# Patient Record
Sex: Male | Born: 1990 | Race: White | Hispanic: No | Marital: Single | Smoking: Former smoker
Health system: Southern US, Community
[De-identification: ages and names within clinical notes are randomized; demographics above are authoritative.]

## PROBLEM LIST (undated history)

## (undated) HISTORY — PX: HERNIA REPAIR: SHX51

---

## 2018-05-27 ENCOUNTER — Inpatient Hospital Stay (HOSPITAL_COMMUNITY): Payer: Self-pay

## 2018-05-27 ENCOUNTER — Emergency Department (HOSPITAL_COMMUNITY): Payer: Self-pay

## 2018-05-27 ENCOUNTER — Inpatient Hospital Stay (HOSPITAL_COMMUNITY)
Admission: EM | Admit: 2018-05-27 | Discharge: 2018-05-30 | DRG: 964 | Disposition: A | Discharge status: Home / Self Care | Payer: Self-pay | Attending: General Surgery | Admitting: General Surgery

## 2018-05-27 ENCOUNTER — Inpatient Hospital Stay (HOSPITAL_COMMUNITY): Payer: Self-pay | Admitting: Anesthesiology

## 2018-05-27 ENCOUNTER — Other Ambulatory Visit: Payer: Self-pay

## 2018-05-27 ENCOUNTER — Encounter (HOSPITAL_COMMUNITY): Payer: Self-pay | Admitting: Anesthesiology

## 2018-05-27 ENCOUNTER — Encounter (HOSPITAL_COMMUNITY): Admission: EM | Disposition: A | Discharge status: Home / Self Care | Payer: Self-pay | Source: Home / Self Care

## 2018-05-27 DIAGNOSIS — S82025A Nondisplaced longitudinal fracture of left patella, initial encounter for closed fracture: Secondary | ICD-10-CM | POA: Diagnosis present

## 2018-05-27 DIAGNOSIS — R402142 Coma scale, eyes open, spontaneous, at arrival to emergency department: Secondary | ICD-10-CM | POA: Diagnosis present

## 2018-05-27 DIAGNOSIS — H1131 Conjunctival hemorrhage, right eye: Secondary | ICD-10-CM | POA: Diagnosis present

## 2018-05-27 DIAGNOSIS — F1290 Cannabis use, unspecified, uncomplicated: Secondary | ICD-10-CM | POA: Diagnosis present

## 2018-05-27 DIAGNOSIS — Y92410 Unspecified street and highway as the place of occurrence of the external cause: Secondary | ICD-10-CM

## 2018-05-27 DIAGNOSIS — F1590 Other stimulant use, unspecified, uncomplicated: Secondary | ICD-10-CM | POA: Diagnosis present

## 2018-05-27 DIAGNOSIS — S0511XA Contusion of eyeball and orbital tissues, right eye, initial encounter: Secondary | ICD-10-CM | POA: Diagnosis present

## 2018-05-27 DIAGNOSIS — Z880 Allergy status to penicillin: Secondary | ICD-10-CM

## 2018-05-27 DIAGNOSIS — J939 Pneumothorax, unspecified: Secondary | ICD-10-CM

## 2018-05-27 DIAGNOSIS — S062X0A Diffuse traumatic brain injury without loss of consciousness, initial encounter: Secondary | ICD-10-CM

## 2018-05-27 DIAGNOSIS — Z419 Encounter for procedure for purposes other than remedying health state, unspecified: Secondary | ICD-10-CM

## 2018-05-27 DIAGNOSIS — S270XXA Traumatic pneumothorax, initial encounter: Secondary | ICD-10-CM | POA: Diagnosis present

## 2018-05-27 DIAGNOSIS — R402362 Coma scale, best motor response, obeys commands, at arrival to emergency department: Secondary | ICD-10-CM | POA: Diagnosis present

## 2018-05-27 DIAGNOSIS — S82035A Nondisplaced transverse fracture of left patella, initial encounter for closed fracture: Secondary | ICD-10-CM | POA: Diagnosis present

## 2018-05-27 DIAGNOSIS — S32421A Displaced fracture of posterior wall of right acetabulum, initial encounter for closed fracture: Secondary | ICD-10-CM | POA: Diagnosis present

## 2018-05-27 DIAGNOSIS — S82045A Nondisplaced comminuted fracture of left patella, initial encounter for closed fracture: Secondary | ICD-10-CM | POA: Diagnosis present

## 2018-05-27 DIAGNOSIS — S32401A Unspecified fracture of right acetabulum, initial encounter for closed fracture: Secondary | ICD-10-CM

## 2018-05-27 DIAGNOSIS — F17210 Nicotine dependence, cigarettes, uncomplicated: Secondary | ICD-10-CM | POA: Diagnosis present

## 2018-05-27 DIAGNOSIS — S0240CA Maxillary fracture, right side, initial encounter for closed fracture: Secondary | ICD-10-CM | POA: Diagnosis present

## 2018-05-27 DIAGNOSIS — S0292XA Unspecified fracture of facial bones, initial encounter for closed fracture: Secondary | ICD-10-CM

## 2018-05-27 DIAGNOSIS — R402252 Coma scale, best verbal response, oriented, at arrival to emergency department: Secondary | ICD-10-CM | POA: Diagnosis present

## 2018-05-27 DIAGNOSIS — S062X9A Diffuse traumatic brain injury with loss of consciousness of unspecified duration, initial encounter: Principal | ICD-10-CM | POA: Diagnosis present

## 2018-05-27 HISTORY — PX: ORIF ACETABULAR FRACTURE: SHX5029

## 2018-05-27 LAB — CBC
HEMATOCRIT: 43.6 % (ref 39.0–52.0)
HEMOGLOBIN: 14.6 g/dL (ref 13.0–17.0)
MCH: 29 pg (ref 26.0–34.0)
MCHC: 33.5 g/dL (ref 30.0–36.0)
MCV: 86.7 fL (ref 78.0–100.0)
Platelets: 265 10*3/uL (ref 150–400)
RBC: 5.03 MIL/uL (ref 4.22–5.81)
RDW: 12.9 % (ref 11.5–15.5)
WBC: 8.2 10*3/uL (ref 4.0–10.5)

## 2018-05-27 LAB — URINALYSIS, ROUTINE W REFLEX MICROSCOPIC
Bilirubin Urine: NEGATIVE
GLUCOSE, UA: NEGATIVE mg/dL
Hgb urine dipstick: NEGATIVE
Ketones, ur: NEGATIVE mg/dL
LEUKOCYTES UA: NEGATIVE
Nitrite: NEGATIVE
PH: 7 (ref 5.0–8.0)
Protein, ur: NEGATIVE mg/dL
Specific Gravity, Urine: 1.031 — ABNORMAL HIGH (ref 1.005–1.030)

## 2018-05-27 LAB — COMPREHENSIVE METABOLIC PANEL
ALT: 32 U/L (ref 0–44)
ANION GAP: 5 (ref 5–15)
AST: 33 U/L (ref 15–41)
Albumin: 3.7 g/dL (ref 3.5–5.0)
Alkaline Phosphatase: 65 U/L (ref 38–126)
BUN: 10 mg/dL (ref 6–20)
CHLORIDE: 108 mmol/L (ref 98–111)
CO2: 24 mmol/L (ref 22–32)
Calcium: 9 mg/dL (ref 8.9–10.3)
Creatinine, Ser: 0.88 mg/dL (ref 0.61–1.24)
GFR calc non Af Amer: >60 mL/min (ref >60)
Glucose, Bld: 103 mg/dL — ABNORMAL HIGH (ref 70–99)
Potassium: 3.7 mmol/L (ref 3.5–5.1)
SODIUM: 137 mmol/L (ref 135–145)
Total Bilirubin: 0.6 mg/dL (ref 0.3–1.2)
Total Protein: 6 g/dL — ABNORMAL LOW (ref 6.5–8.1)

## 2018-05-27 LAB — I-STAT CHEM 8, ED
BUN: 11 mg/dL (ref 6–20)
CALCIUM ION: 1.24 mmol/L (ref 1.15–1.40)
Chloride: 103 mmol/L (ref 98–111)
Creatinine, Ser: 0.8 mg/dL (ref 0.61–1.24)
GLUCOSE: 98 mg/dL (ref 70–99)
HCT: 42 % (ref 39.0–52.0)
HEMOGLOBIN: 14.3 g/dL (ref 13.0–17.0)
POTASSIUM: 3.7 mmol/L (ref 3.5–5.1)
Sodium: 139 mmol/L (ref 135–145)
TCO2: 24 mmol/L (ref 22–32)

## 2018-05-27 LAB — I-STAT TROPONIN, ED: TROPONIN I, POC: 0.01 ng/mL (ref 0.00–0.08)

## 2018-05-27 LAB — SAMPLE TO BLOOD BANK

## 2018-05-27 LAB — PROTIME-INR
INR: 0.92
Prothrombin Time: 12.2 seconds (ref 11.4–15.2)

## 2018-05-27 LAB — RAPID URINE DRUG SCREEN, HOSP PERFORMED
Amphetamines: NOT DETECTED
Barbiturates: NOT DETECTED
Benzodiazepines: NOT DETECTED
COCAINE: NOT DETECTED
OPIATES: NOT DETECTED
Tetrahydrocannabinol: NOT DETECTED

## 2018-05-27 LAB — ETHANOL: Alcohol, Ethyl (B): <10 mg/dL (ref <10)

## 2018-05-27 LAB — CDS SEROLOGY

## 2018-05-27 LAB — I-STAT CG4 LACTIC ACID, ED: Lactic Acid, Venous: 1.84 mmol/L (ref 0.5–1.9)

## 2018-05-27 SURGERY — OPEN REDUCTION INTERNAL FIXATION (ORIF) ACETABULAR FRACTURE
Anesthesia: General | Site: Pelvis | Laterality: Right

## 2018-05-27 MED ORDER — LIDOCAINE 2% (20 MG/ML) 5 ML SYRINGE
INTRAMUSCULAR | Status: DC | PRN
  Administered 2018-05-27: 60 mg via INTRAVENOUS

## 2018-05-27 MED ORDER — SODIUM CHLORIDE 0.9 % IJ SOLN
INTRAMUSCULAR | Status: AC
  Filled 2018-05-27: qty 10

## 2018-05-27 MED ORDER — HYDROMORPHONE HCL 1 MG/ML IJ SOLN: 0.2500 mg | INTRAMUSCULAR | Status: DC | PRN

## 2018-05-27 MED ORDER — KCL IN DEXTROSE-NACL 20-5-0.45 MEQ/L-%-% IV SOLN
INTRAVENOUS | Status: DC
  Administered 2018-05-28 (×2): via INTRAVENOUS
  Filled 2018-05-27 (×4): qty 1000

## 2018-05-27 MED ORDER — ROCURONIUM BROMIDE 50 MG/5ML IV SOSY
PREFILLED_SYRINGE | INTRAVENOUS | Status: AC
  Filled 2018-05-27: qty 5

## 2018-05-27 MED ORDER — CHLORHEXIDINE GLUCONATE 4 % EX LIQD: 60.0000 mL | Freq: Once | CUTANEOUS | Status: DC

## 2018-05-27 MED ORDER — GLYCOPYRROLATE PF 0.2 MG/ML IJ SOSY
PREFILLED_SYRINGE | INTRAMUSCULAR | Status: AC
  Filled 2018-05-27: qty 2

## 2018-05-27 MED ORDER — SUGAMMADEX SODIUM 500 MG/5ML IV SOLN
INTRAVENOUS | Status: AC
  Filled 2018-05-27: qty 5

## 2018-05-27 MED ORDER — FENTANYL CITRATE (PF) 100 MCG/2ML IJ SOLN
INTRAMUSCULAR | Status: AC | PRN
  Administered 2018-05-27: 50 ug via INTRAVENOUS

## 2018-05-27 MED ORDER — SALINE SPRAY 0.65 % NA SOLN
4.0000 | Freq: Four times a day (QID) | NASAL | Status: DC
  Administered 2018-05-27: 4 via NASAL
  Filled 2018-05-27 (×2): qty 44

## 2018-05-27 MED ORDER — MIDAZOLAM HCL 2 MG/2ML IJ SOLN
INTRAMUSCULAR | Status: AC
  Filled 2018-05-27: qty 2

## 2018-05-27 MED ORDER — SODIUM CHLORIDE 0.9 % IV BOLUS
1000.0000 mL | Freq: Once | INTRAVENOUS | Status: AC
  Administered 2018-05-27: 1000 mL via INTRAVENOUS

## 2018-05-27 MED ORDER — CEFAZOLIN SODIUM-DEXTROSE 2-4 GM/100ML-% IV SOLN
2.0000 g | INTRAVENOUS | Status: AC
  Administered 2018-05-27: 2 g via INTRAVENOUS

## 2018-05-27 MED ORDER — CEFAZOLIN SODIUM-DEXTROSE 2-4 GM/100ML-% IV SOLN
INTRAVENOUS | Status: AC
  Filled 2018-05-27: qty 100

## 2018-05-27 MED ORDER — FENTANYL CITRATE (PF) 100 MCG/2ML IJ SOLN
INTRAMUSCULAR | Status: DC | PRN
  Administered 2018-05-27: 100 ug via INTRAVENOUS

## 2018-05-27 MED ORDER — ONDANSETRON HCL 4 MG/2ML IJ SOLN: 4.0000 mg | Freq: Four times a day (QID) | INTRAMUSCULAR | Status: DC | PRN

## 2018-05-27 MED ORDER — LACTATED RINGERS IV SOLN
INTRAVENOUS | Status: DC
  Administered 2018-05-27: 14:00:00 via INTRAVENOUS

## 2018-05-27 MED ORDER — HYDROMORPHONE HCL 1 MG/ML IJ SOLN
1.0000 mg | INTRAMUSCULAR | Status: DC | PRN
  Administered 2018-05-27 – 2018-05-30 (×10): 1 mg via INTRAVENOUS
  Filled 2018-05-27 (×10): qty 1

## 2018-05-27 MED ORDER — FAMOTIDINE IN NACL 20-0.9 MG/50ML-% IV SOLN
20.0000 mg | Freq: Two times a day (BID) | INTRAVENOUS | Status: DC
  Administered 2018-05-27: 20 mg via INTRAVENOUS
  Filled 2018-05-27 (×2): qty 50

## 2018-05-27 MED ORDER — PROPOFOL 10 MG/ML IV BOLUS
INTRAVENOUS | Status: DC | PRN
  Administered 2018-05-27: 190 mg via INTRAVENOUS

## 2018-05-27 MED ORDER — SUCCINYLCHOLINE CHLORIDE 200 MG/10ML IV SOSY
PREFILLED_SYRINGE | INTRAVENOUS | Status: AC
  Filled 2018-05-27: qty 10

## 2018-05-27 MED ORDER — MIDAZOLAM HCL 5 MG/5ML IJ SOLN
INTRAMUSCULAR | Status: DC | PRN
  Administered 2018-05-27: 2 mg via INTRAVENOUS

## 2018-05-27 MED ORDER — IOPAMIDOL (ISOVUE-370) INJECTION 76%
100.0000 mL | Freq: Once | INTRAVENOUS | Status: AC | PRN
  Administered 2018-05-27: 100 mL via INTRAVENOUS

## 2018-05-27 MED ORDER — OXYCODONE HCL 5 MG PO TABS: 5.0000 mg | ORAL_TABLET | Freq: Once | ORAL | Status: DC | PRN

## 2018-05-27 MED ORDER — BUPIVACAINE-EPINEPHRINE 0.25% -1:200000 IJ SOLN
INTRAMUSCULAR | Status: DC | PRN
  Administered 2018-05-27: 30 mL

## 2018-05-27 MED ORDER — BUPIVACAINE-EPINEPHRINE (PF) 0.25% -1:200000 IJ SOLN
INTRAMUSCULAR | Status: AC
  Filled 2018-05-27: qty 30

## 2018-05-27 MED ORDER — POVIDONE-IODINE 10 % EX SWAB: 2.0000 "application " | Freq: Once | CUTANEOUS | Status: DC

## 2018-05-27 MED ORDER — IOPAMIDOL (ISOVUE-370) INJECTION 76%
INTRAVENOUS | Status: AC
  Filled 2018-05-27: qty 100

## 2018-05-27 MED ORDER — FENTANYL CITRATE (PF) 100 MCG/2ML IJ SOLN
INTRAMUSCULAR | Status: AC
  Filled 2018-05-27: qty 2

## 2018-05-27 MED ORDER — OXYCODONE HCL 5 MG PO TABS
5.0000 mg | ORAL_TABLET | ORAL | Status: DC | PRN
  Administered 2018-05-27 – 2018-05-30 (×12): 10 mg via ORAL
  Filled 2018-05-27 (×12): qty 2

## 2018-05-27 MED ORDER — FENTANYL CITRATE (PF) 100 MCG/2ML IJ SOLN
INTRAMUSCULAR | Status: AC | PRN
  Administered 2018-05-27: 25 ug via INTRAVENOUS

## 2018-05-27 MED ORDER — PANTOPRAZOLE SODIUM 40 MG PO TBEC
40.0000 mg | DELAYED_RELEASE_TABLET | Freq: Two times a day (BID) | ORAL | Status: DC
  Administered 2018-05-27 – 2018-05-30 (×6): 40 mg via ORAL
  Filled 2018-05-27 (×6): qty 1

## 2018-05-27 MED ORDER — FENTANYL CITRATE (PF) 250 MCG/5ML IJ SOLN
INTRAMUSCULAR | Status: AC
  Filled 2018-05-27: qty 5

## 2018-05-27 MED ORDER — ROCURONIUM BROMIDE 10 MG/ML (PF) SYRINGE
PREFILLED_SYRINGE | INTRAVENOUS | Status: DC | PRN
  Administered 2018-05-27: 50 mg via INTRAVENOUS

## 2018-05-27 MED ORDER — ONDANSETRON 4 MG PO TBDP: 4.0000 mg | ORAL_TABLET | Freq: Four times a day (QID) | ORAL | Status: DC | PRN

## 2018-05-27 MED ORDER — PROPOFOL 10 MG/ML IV BOLUS
INTRAVENOUS | Status: AC
  Filled 2018-05-27: qty 20

## 2018-05-27 MED ORDER — ACETAMINOPHEN 500 MG PO TABS
1000.0000 mg | ORAL_TABLET | Freq: Three times a day (TID) | ORAL | Status: DC
  Administered 2018-05-27 – 2018-05-30 (×8): 1000 mg via ORAL
  Filled 2018-05-27 (×8): qty 2

## 2018-05-27 MED ORDER — SUGAMMADEX SODIUM 500 MG/5ML IV SOLN
INTRAVENOUS | Status: DC | PRN
  Administered 2018-05-27: 500 mg via INTRAVENOUS

## 2018-05-27 MED ORDER — LIDOCAINE 2% (20 MG/ML) 5 ML SYRINGE
INTRAMUSCULAR | Status: AC
  Filled 2018-05-27: qty 5

## 2018-05-27 MED ORDER — EPHEDRINE 5 MG/ML INJ
INTRAVENOUS | Status: AC
  Filled 2018-05-27: qty 20

## 2018-05-27 MED ORDER — OXYCODONE HCL 5 MG/5ML PO SOLN: 5.0000 mg | Freq: Once | ORAL | Status: DC | PRN

## 2018-05-27 MED ORDER — FENTANYL CITRATE (PF) 100 MCG/2ML IJ SOLN
25.0000 ug | Freq: Once | INTRAMUSCULAR | Status: AC
  Administered 2018-05-27: 25 ug via INTRAVENOUS

## 2018-05-27 SURGICAL SUPPLY — 57 items
BIT DRILL STEP 3.5 (DRILL) IMPLANT
BLADE CLIPPER SURG (BLADE) IMPLANT
BRUSH SCRUB SURG 4.25 DISP (MISCELLANEOUS) ×8 IMPLANT
CLOSURE WOUND 1/2 X4 (GAUZE/BANDAGES/DRESSINGS) ×1
COVER SURGICAL LIGHT HANDLE (MISCELLANEOUS) ×4 IMPLANT
DRAPE C-ARM 42X72 X-RAY (DRAPES) ×4 IMPLANT
DRAPE C-ARMOR (DRAPES) ×4 IMPLANT
DRAPE INCISE IOBAN 66X45 STRL (DRAPES) ×4 IMPLANT
DRAPE INCISE IOBAN 85X60 (DRAPES) ×4 IMPLANT
DRAPE ORTHO SPLIT 77X108 STRL (DRAPES) ×4
DRAPE SURG ORHT 6 SPLT 77X108 (DRAPES) ×4 IMPLANT
DRAPE U-SHAPE 47X51 STRL (DRAPES) ×4 IMPLANT
DRILL STEP 3.5 (DRILL)
DRSG MEPILEX BORDER 4X12 (GAUZE/BANDAGES/DRESSINGS) IMPLANT
DRSG MEPILEX BORDER 4X8 (GAUZE/BANDAGES/DRESSINGS) IMPLANT
ELECT BLADE 6.5 EXT (BLADE) ×4 IMPLANT
ELECT REM PT RETURN 9FT ADLT (ELECTROSURGICAL)
ELECTRODE REM PT RTRN 9FT ADLT (ELECTROSURGICAL) IMPLANT
GLOVE BIO SURGEON STRL SZ7.5 (GLOVE) ×4 IMPLANT
GLOVE BIO SURGEON STRL SZ8 (GLOVE) ×4 IMPLANT
GLOVE BIOGEL PI IND STRL 7.5 (GLOVE) ×2 IMPLANT
GLOVE BIOGEL PI IND STRL 8 (GLOVE) ×2 IMPLANT
GLOVE BIOGEL PI INDICATOR 7.5 (GLOVE) ×2
GLOVE BIOGEL PI INDICATOR 8 (GLOVE) ×2
GOWN STRL REUS W/ TWL LRG LVL3 (GOWN DISPOSABLE) ×4 IMPLANT
GOWN STRL REUS W/ TWL XL LVL3 (GOWN DISPOSABLE) ×4 IMPLANT
GOWN STRL REUS W/TWL LRG LVL3 (GOWN DISPOSABLE) ×4
GOWN STRL REUS W/TWL XL LVL3 (GOWN DISPOSABLE) ×4
HANDPIECE INTERPULSE COAX TIP (DISPOSABLE)
KIT BASIN OR (CUSTOM PROCEDURE TRAY) ×4 IMPLANT
KIT TURNOVER KIT B (KITS) ×4 IMPLANT
MANIFOLD NEPTUNE II (INSTRUMENTS) ×4 IMPLANT
NS IRRIG 1000ML POUR BTL (IV SOLUTION) ×4 IMPLANT
PACK TOTAL JOINT (CUSTOM PROCEDURE TRAY) ×4 IMPLANT
PAD ARMBOARD 7.5X6 YLW CONV (MISCELLANEOUS) ×8 IMPLANT
RETRIEVER SUT HEWSON (MISCELLANEOUS) IMPLANT
SET HNDPC FAN SPRY TIP SCT (DISPOSABLE) IMPLANT
SPONGE LAP 18X18 X RAY DECT (DISPOSABLE) IMPLANT
STAPLER VISISTAT 35W (STAPLE) IMPLANT
STRIP CLOSURE SKIN 1/2X4 (GAUZE/BANDAGES/DRESSINGS) ×3 IMPLANT
SUCTION FRAZIER HANDLE 10FR (MISCELLANEOUS)
SUCTION TUBE FRAZIER 10FR DISP (MISCELLANEOUS) IMPLANT
SUT ETHILON 2 0 PSLX (SUTURE) ×8 IMPLANT
SUT FIBERWIRE #2 38 T-5 BLUE (SUTURE) ×8
SUT VIC AB 0 CT1 27 (SUTURE) ×2
SUT VIC AB 0 CT1 27XBRD ANBCTR (SUTURE) ×2 IMPLANT
SUT VIC AB 1 CT1 18XCR BRD 8 (SUTURE) ×2 IMPLANT
SUT VIC AB 1 CT1 27 (SUTURE) ×2
SUT VIC AB 1 CT1 27XBRD ANBCTR (SUTURE) ×2 IMPLANT
SUT VIC AB 1 CT1 8-18 (SUTURE) ×2
SUT VIC AB 2-0 CT1 27 (SUTURE) ×2
SUT VIC AB 2-0 CT1 TAPERPNT 27 (SUTURE) ×2 IMPLANT
SUTURE FIBERWR #2 38 T-5 BLUE (SUTURE) ×4 IMPLANT
TOWEL OR 17X24 6PK STRL BLUE (TOWEL DISPOSABLE) ×8 IMPLANT
TOWEL OR 17X26 10 PK STRL BLUE (TOWEL DISPOSABLE) ×8 IMPLANT
TRAY FOLEY MTR SLVR 16FR STAT (SET/KITS/TRAYS/PACK) IMPLANT
WATER STERILE IRR 1000ML POUR (IV SOLUTION) IMPLANT
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
## 2018-05-27 NOTE — ED Notes (Signed)
Called over to CT due to increase pain, fentanyl IV per standing order set.
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
## 2018-05-27 NOTE — Anesthesia Procedure Notes (Signed)
Procedure Name: Intubation Date/Time: 05/27/2018 3:55 PM Performed by: Moshe Salisbury, CRNA Pre-anesthesia Checklist: Patient identified, Emergency Drugs available, Suction available and Patient being monitored Patient Re-evaluated:Patient Re-evaluated prior to induction Oxygen Delivery Method: Circle System Utilized Preoxygenation: Pre-oxygenation with 100% oxygen Induction Type: IV induction Ventilation: Mask ventilation without difficulty Laryngoscope Size: Mac and 4 Grade View: Grade II Tube type: Oral Tube size: 8.0 mm Number of attempts: 1 Airway Equipment and Method: Stylet Placement Confirmation: ETT inserted through vocal cords under direct vision,  positive ETCO2 and breath sounds checked- equal and bilateral Secured at: 22 cm Tube secured with: Tape Dental Injury: Teeth and Oropharynx as per pre-operative assessment

## 2018-05-27 NOTE — Progress Notes (Signed)
Chaplain to Trauma C. Patient not available.  EMS contacted his girlfriend who will be on her way. Patient is stable and girlfriend will be able to see him when she arrives.  Chaplain can be re-paged if needed.   05/27/18 0700  Clinical Encounter Type  Visited With Patient not available  Visit Type ED;Trauma  Referral From Physician  Consult/Referral To Chaplain
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
## 2018-05-27 NOTE — Transfer of Care (Signed)
Immediate Anesthesia Transfer of Care Note  Patient: Brent Owens  Procedure(s) Performed: TREATMENT OF ACETABULAR FRACUTURE UNDER ANESTHESIA (Right Pelvis) TREATMENT OF LEFT KNEE UNDER ANESTHESIA, ASPIRATION OF LEFT KNEE, INJECTION LOCAL ANESTHETIC  LEFT KNEE (Left Knee)  Patient Location: PACU  Anesthesia Type:General  Level of Consciousness: drowsy  Airway & Oxygen Therapy: Patient Spontanous Breathing and Patient connected to nasal cannula oxygen  Post-op Assessment: Report given to RN, Post -op Vital signs reviewed and stable and Patient moving all extremities  Post vital signs: Reviewed and stable  Last Vitals:  Vitals Value Taken Time  BP 109/45 05/27/2018  4:34 PM  Temp    Pulse 72 05/27/2018  4:37 PM  Resp 16 05/27/2018  4:37 PM  SpO2 100 % 05/27/2018  4:37 PM  Vitals shown include unvalidated device data.  Last Pain:  Vitals:   05/27/18 1227  TempSrc: Oral  PainSc:          Complications: No apparent anesthesia complications

## 2018-05-27 NOTE — Progress Notes (Signed)
Orthopedic Tech Progress Note Patient Details:  Cheri RousWakefield Jj Doe 10/05/1874 [id number] Made level 2 trauma visit Patient ID: Clyda GreenerWakefield Jj Doe, male   DOB: 10/05/1874, [age] y.o.   MRN: [id number]   Nikki DomCrawford, Kymir Coles 05/27/2018, 7:24 AM
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
## 2018-05-27 NOTE — ED Notes (Signed)
Family at bedside. 

## 2018-05-27 NOTE — Anesthesia Preprocedure Evaluation (Addendum)
Anesthesia Evaluation  Patient identified by MRN, date of birth, ID band Patient awake    Reviewed: Allergy & Precautions, H&P , NPO status , Patient's Chart, lab work & pertinent test results  Airway Mallampati: II  TM Distance: >3 FB Neck ROM: full    Dental  (+) Teeth Intact, Dental Advisory Given, Chipped   Pulmonary neg pulmonary ROS,    breath sounds clear to auscultation       Cardiovascular negative cardio ROS   Rhythm:regular Rate:Normal     Neuro/Psych    GI/Hepatic   Endo/Other    Renal/GU      Musculoskeletal   Abdominal   Peds  Hematology   Anesthesia Other Findings   Reproductive/Obstetrics                            Anesthesia Physical Anesthesia Plan  ASA: I  Anesthesia Plan: General   Post-op Pain Management:    Induction: Intravenous  PONV Risk Score and Plan: 2 and Ondansetron, Dexamethasone, Midazolam and Treatment may vary due to age or medical condition  Airway Management Planned: Oral ETT  Additional Equipment:   Intra-op Plan:   Post-operative Plan: Extubation in OR  Informed Consent: I have reviewed the patients History and Physical, chart, labs and discussed the procedure including the risks, benefits and alternatives for the proposed anesthesia with the patient or authorized representative who has indicated his/her understanding and acceptance.     Plan Discussed with: CRNA, Anesthesiologist and Surgeon  Anesthesia Plan Comments:         Anesthesia Quick Evaluation

## 2018-05-27 NOTE — ED Notes (Addendum)
Report attempted nurse to call back.  
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
## 2018-05-28 ENCOUNTER — Inpatient Hospital Stay (HOSPITAL_COMMUNITY): Payer: Self-pay

## 2018-05-28 LAB — CBC
HCT: 38.5 % — ABNORMAL LOW (ref 39.0–52.0)
HEMOGLOBIN: 12.9 g/dL — AB (ref 13.0–17.0)
MCH: 28.9 pg (ref 26.0–34.0)
MCHC: 33.5 g/dL (ref 30.0–36.0)
MCV: 86.3 fL (ref 78.0–100.0)
PLATELETS: 214 10*3/uL (ref 150–400)
RBC: 4.46 MIL/uL (ref 4.22–5.81)
RDW: 13 % (ref 11.5–15.5)
WBC: 9.2 10*3/uL (ref 4.0–10.5)

## 2018-05-28 LAB — BASIC METABOLIC PANEL
ANION GAP: 6 (ref 5–15)
BUN: 8 mg/dL (ref 6–20)
CO2: 24 mmol/L (ref 22–32)
Calcium: 8.5 mg/dL — ABNORMAL LOW (ref 8.9–10.3)
Chloride: 107 mmol/L (ref 98–111)
Creatinine, Ser: 0.75 mg/dL (ref 0.61–1.24)
Glucose, Bld: 100 mg/dL — ABNORMAL HIGH (ref 70–99)
Potassium: 4.1 mmol/L (ref 3.5–5.1)
SODIUM: 137 mmol/L (ref 135–145)

## 2018-05-28 LAB — HIV ANTIBODY (ROUTINE TESTING W REFLEX): HIV Screen 4th Generation wRfx: NONREACTIVE

## 2018-05-28 MED ORDER — ENSURE ENLIVE PO LIQD
237.0000 mL | Freq: Two times a day (BID) | ORAL | Status: DC
  Administered 2018-05-28 – 2018-05-30 (×5): 237 mL via ORAL

## 2018-05-28 MED ORDER — NICOTINE 14 MG/24HR TD PT24
14.0000 mg | MEDICATED_PATCH | Freq: Every day | TRANSDERMAL | Status: DC
  Administered 2018-05-28 – 2018-05-29 (×2): 14 mg via TRANSDERMAL
  Filled 2018-05-28 (×3): qty 1

## 2018-05-28 MED ORDER — ENOXAPARIN SODIUM 40 MG/0.4ML ~~LOC~~ SOLN
40.0000 mg | SUBCUTANEOUS | Status: DC
  Administered 2018-05-28 – 2018-05-29 (×2): 40 mg via SUBCUTANEOUS
  Filled 2018-05-28 (×2): qty 0.4
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
## 2018-05-28 NOTE — Progress Notes (Signed)
CSW spoke with pt and fiance at bedside. CSW spoke with pt regarding substance use and resources at this time. Pt receptive and accepting of resources at this time.   Claude MangesKierra S. Graclyn Lawther, MSW, LCSW-A Emergency Department Clinical Social Worker [phone]

## 2018-05-28 NOTE — Anesthesia Postprocedure Evaluation (Signed)
Anesthesia Post Note  Patient: Brent GheeJames Dillon Owens  Procedure(s) Performed: TREATMENT OF ACETABULAR FRACUTURE UNDER ANESTHESIA (Right Pelvis) TREATMENT OF LEFT KNEE UNDER ANESTHESIA, ASPIRATION OF LEFT KNEE, INJECTION LOCAL ANESTHETIC  LEFT KNEE (Left Knee)     Patient location during evaluation: PACU Anesthesia Type: General Level of consciousness: awake and alert Pain management: pain level controlled Vital Signs Assessment: post-procedure vital signs reviewed and stable Respiratory status: spontaneous breathing, nonlabored ventilation, respiratory function stable and patient connected to nasal cannula oxygen Cardiovascular status: blood pressure returned to baseline and stable Postop Assessment: no apparent nausea or vomiting Anesthetic complications: no    Last Vitals:  Vitals:   05/28/18 0400 05/28/18 0838  BP: (!) 143/69 124/76  Pulse: 67 78  Resp: 11 11  Temp: 36.6 C 36.4 C  SpO2: 99% 100%    Last Pain:  Vitals:   05/28/18 0838  TempSrc: Oral  PainSc:                  [name] S
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
## 2018-05-29 LAB — BASIC METABOLIC PANEL
Anion gap: 9 (ref 5–15)
BUN: 8 mg/dL (ref 6–20)
CHLORIDE: 104 mmol/L (ref 98–111)
CO2: 25 mmol/L (ref 22–32)
CREATININE: 0.87 mg/dL (ref 0.61–1.24)
Calcium: 8.9 mg/dL (ref 8.9–10.3)
GFR calc Af Amer: >60 mL/min (ref >60)
Glucose, Bld: 94 mg/dL (ref 70–99)
POTASSIUM: 4.2 mmol/L (ref 3.5–5.1)
Sodium: 138 mmol/L (ref 135–145)

## 2018-05-29 LAB — CBC
HCT: 39.4 % (ref 39.0–52.0)
HEMOGLOBIN: 13.3 g/dL (ref 13.0–17.0)
MCH: 29.1 pg (ref 26.0–34.0)
MCHC: 33.8 g/dL (ref 30.0–36.0)
MCV: 86.2 fL (ref 78.0–100.0)
Platelets: 223 10*3/uL (ref 150–400)
RBC: 4.57 MIL/uL (ref 4.22–5.81)
RDW: 12.8 % (ref 11.5–15.5)
WBC: 9 10*3/uL (ref 4.0–10.5)
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
## 2018-05-29 NOTE — Progress Notes (Signed)
Patient complaining of new onset numbness/tingling to right side of face at cheekbone. Spoke to Trauma MD to make aware.
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
## 2018-05-30 ENCOUNTER — Encounter (HOSPITAL_COMMUNITY): Payer: Self-pay | Admitting: Orthopedic Surgery

## 2018-05-30 MED ORDER — OXYCODONE HCL 5 MG PO TABS: 5.0000 mg | ORAL_TABLET | Freq: Four times a day (QID) | ORAL | 0 refills | Status: DC | PRN

## 2018-05-30 MED ORDER — TRAMADOL HCL 50 MG PO TABS: 50.0000 mg | ORAL_TABLET | Freq: Four times a day (QID) | ORAL | 0 refills | Status: DC | PRN

## 2018-05-30 MED ORDER — METHOCARBAMOL 500 MG PO TABS: 500.0000 mg | ORAL_TABLET | Freq: Three times a day (TID) | ORAL | Status: DC | PRN

## 2018-05-30 MED ORDER — ACETAMINOPHEN 500 MG PO TABS
1000.0000 mg | ORAL_TABLET | Freq: Four times a day (QID) | ORAL | Status: DC
  Administered 2018-05-30: 1000 mg via ORAL
  Filled 2018-05-30: qty 2

## 2018-05-30 MED ORDER — TRAMADOL HCL 50 MG PO TABS
50.0000 mg | ORAL_TABLET | Freq: Four times a day (QID) | ORAL | Status: DC
  Administered 2018-05-30: 50 mg via ORAL
  Filled 2018-05-30: qty 1

## 2018-05-30 MED ORDER — METHOCARBAMOL 500 MG PO TABS: 500.0000 mg | ORAL_TABLET | Freq: Three times a day (TID) | ORAL | 0 refills | Status: DC | PRN

## 2018-05-30 MED ORDER — ASPIRIN EC 325 MG PO TBEC: 325.0000 mg | DELAYED_RELEASE_TABLET | Freq: Every day | ORAL | 0 refills | Status: AC

## 2018-05-30 MED ORDER — ACETAMINOPHEN 500 MG PO TABS: 1000.0000 mg | ORAL_TABLET | Freq: Four times a day (QID) | ORAL | 0 refills | Status: DC | PRN

## 2018-05-30 NOTE — Progress Notes (Signed)
Pt being discharged home via wheelchair with family. Pt alert and oriented x4. VSS. Pt c/o no pain at this time. No signs of respiratory distress. Education complete and care plans resolved. IV removed with catheter intact and pt tolerated well. No further issues at this time. Pt to follow up with PCP. [place] Wenzlick R, RN 
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
## 2018-05-30 NOTE — Discharge Instructions (Signed)
Facial  Fracture precautions: 1. Elevate head of bed 2. Ice compress to periorbital region 3. Avoid additional trauma, nose blowing or sneezing 4. Liquid and soft diet as tolerated for 6 weeks. 5. Saline nasal spray 4 times a day and when necessary 6.  Facial wound care including half-strength hydrogen peroxide and bacitracin ointment to abrasions and lacerations.  Call Dr. Clovis PuShoemakers office with any questions or concerns regarding facial fractures: [phone]   Pelvic Fracture: - Touch down weight bearing only of your right leg Left Patellar Fracture - weight bearing as tolerated Call Dr. Magdalene PatriciaHandy's office with any questions or concerns regarding these fractures [phone]  Take Aspirin 325mg  daily for 4 weeks.    1. PAIN CONTROL:  1. Pain is best controlled by a usual combination of three different methods TOGETHER:  1. Ice/Heat 2. Over the counter pain medication 3. Prescription pain medication 2. Most patients will experience some swelling and bruising around wounds. Ice packs or heating pads (30-60 minutes up to 6 times a day) will help. Use ice for the first few days to help decrease swelling and bruising, then switch to heat to help relax tight/sore spots and speed recovery. Some people prefer to use ice alone, heat alone, alternating between ice & heat. Experiment to what works for you. Swelling and bruising can take several weeks to resolve.  3. It is helpful to take an over-the-counter pain medication regularly for the first few weeks. Choose one of the following that works best for you:  1. Naproxen (Aleve, etc) Two 220mg  tabs twice a day 2. Ibuprofen (Advil, etc) Three 200mg  tabs four times a day (every meal & bedtime) 3. Acetaminophen (Tylenol, etc) 500-650mg  four times a day (every meal & bedtime) 4. A prescription for pain medication (such as oxycodone, hydrocodone, etc) should be given to you upon discharge. Take your pain medication as prescribed.  1. If you are  having problems/concerns with the prescription medicine (does not control pain, nausea, vomiting, rash, itching, etc), please call us [phone] to see if we need to switch you to a different pain medicine that will work better for you and/or control your side effect better. 2. If you need a refill on your pain medication, please contact your pharmacy. They will contact our office to request authorization. Prescriptions will not be filled after 5 pm or on week-ends. 4. Avoid getting constipated. When taking pain medications, it is common to experience some constipation. Increasing fluid intake and taking a fiber supplement (such as Metamucil, Citrucel, FiberCon, MiraLax, etc) 1-2 times a day regularly will usually help prevent this problem from occurring. A mild laxative (prune juice, Milk of Magnesia, MiraLax, etc) should be taken according to package directions if there are no bowel movements after 48 hours.  5. Watch out for diarrhea. If you have many loose bowel movements, simplify your diet to bland foods & liquids for a few days. Stop any stool softeners and decrease your fiber supplement. Switching to mild anti-diarrheal medications (Kayopectate, Pepto Bismol) can help. If this worsens or does not improve, please call us. 6. Wash / shower every day. You may shower daily and replace your bandges after showering. No bathing or submerging your wounds in water until they heal.  WHEN TO CALL US [phone]:  1. Poor pain control 2. Reactions / problems with new medications (rash/itching, nausea, etc)  3. Fever over 101.5 F (38.5 C) 4. Worsening swelling or bruising 5. Continued bleeding from wounds. 6. Increased pain, redness, or  drainage from the wounds which could be signs of infection  The clinic staff is available to answer your questions during regular business hours (8:30am-5pm). Please dont hesitate to call and ask to speak to one of our nurses for clinical concerns.  If you have a  medical emergency, go to the nearest emergency room or call 911.  A surgeon from [facility] is always on call at the [facility], [place]  [address], [place], [place] [address] ?  MAIN: [phone] ? TOLL FREE: [phone] ?  FAX [phone]  [email or web address]
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
## 2019-06-29 ENCOUNTER — Other Ambulatory Visit: Payer: Self-pay

## 2019-06-29 ENCOUNTER — Emergency Department (HOSPITAL_COMMUNITY): Payer: Self-pay

## 2019-06-29 ENCOUNTER — Emergency Department (HOSPITAL_COMMUNITY)
Admission: EM | Admit: 2019-06-29 | Discharge: 2019-06-29 | Disposition: A | Discharge status: Home / Self Care | Payer: Self-pay | Attending: Emergency Medicine | Admitting: Emergency Medicine

## 2019-06-29 ENCOUNTER — Encounter (HOSPITAL_COMMUNITY): Payer: Self-pay | Admitting: Emergency Medicine

## 2019-06-29 DIAGNOSIS — Z23 Encounter for immunization: Secondary | ICD-10-CM | POA: Insufficient documentation

## 2019-06-29 DIAGNOSIS — Y9289 Other specified places as the place of occurrence of the external cause: Secondary | ICD-10-CM | POA: Insufficient documentation

## 2019-06-29 DIAGNOSIS — Y9389 Activity, other specified: Secondary | ICD-10-CM | POA: Insufficient documentation

## 2019-06-29 DIAGNOSIS — W293XXA Contact with powered garden and outdoor hand tools and machinery, initial encounter: Secondary | ICD-10-CM | POA: Insufficient documentation

## 2019-06-29 DIAGNOSIS — S81812A Laceration without foreign body, left lower leg, initial encounter: Secondary | ICD-10-CM | POA: Insufficient documentation

## 2019-06-29 DIAGNOSIS — F17200 Nicotine dependence, unspecified, uncomplicated: Secondary | ICD-10-CM | POA: Insufficient documentation

## 2019-06-29 DIAGNOSIS — Y990 Civilian activity done for income or pay: Secondary | ICD-10-CM | POA: Insufficient documentation

## 2019-06-29 MED ORDER — LIDOCAINE HCL (PF) 1 % IJ SOLN
5.0000 mL | Freq: Once | INTRAMUSCULAR | Status: DC
  Filled 2019-06-29: qty 6

## 2019-06-29 MED ORDER — TETANUS-DIPHTH-ACELL PERTUSSIS 5-2.5-18.5 LF-MCG/0.5 IM SUSP
0.5000 mL | Freq: Once | INTRAMUSCULAR | Status: AC
  Administered 2019-06-29: 0.5 mL via INTRAMUSCULAR
  Filled 2019-06-29: qty 0.5

## 2019-06-29 MED ORDER — ACETAMINOPHEN 325 MG PO TABS
650.0000 mg | ORAL_TABLET | Freq: Once | ORAL | Status: AC
  Administered 2019-06-29: 21:00:00 650 mg via ORAL
  Filled 2019-06-29: qty 2

## 2019-06-29 NOTE — ED Notes (Signed)
Wound cleansed 

## 2019-06-29 NOTE — ED Notes (Signed)
EDP to room  
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
## 2019-06-29 NOTE — Discharge Instructions (Addendum)
We have updated your tetanus vaccine on today's visit.  I have placed 5 staples to your left shin, these will need to be removed by a medical provider within 7 to 10 days.  If you experience any redness to the wound, drainage, fevers please return to the emergency department.

## 2019-06-29 NOTE — ED Triage Notes (Signed)
Pt arrives to ED c/o of laceration to left shin after hitting it with a chain saw this evening. Laceration is approx 2 inches in length with bleeding controlled at this time.

## 2019-12-24 ENCOUNTER — Encounter (HOSPITAL_COMMUNITY): Payer: Self-pay | Admitting: Emergency Medicine

## 2019-12-24 ENCOUNTER — Emergency Department (HOSPITAL_COMMUNITY)
Admission: EM | Admit: 2019-12-24 | Discharge: 2019-12-24 | Disposition: A | Discharge status: Home / Self Care | Payer: Self-pay | Attending: Emergency Medicine | Admitting: Emergency Medicine

## 2019-12-24 ENCOUNTER — Other Ambulatory Visit: Payer: Self-pay

## 2019-12-24 DIAGNOSIS — W293XXA Contact with powered garden and outdoor hand tools and machinery, initial encounter: Secondary | ICD-10-CM | POA: Insufficient documentation

## 2019-12-24 DIAGNOSIS — Y9289 Other specified places as the place of occurrence of the external cause: Secondary | ICD-10-CM | POA: Insufficient documentation

## 2019-12-24 DIAGNOSIS — S71112A Laceration without foreign body, left thigh, initial encounter: Secondary | ICD-10-CM | POA: Insufficient documentation

## 2019-12-24 DIAGNOSIS — F17210 Nicotine dependence, cigarettes, uncomplicated: Secondary | ICD-10-CM | POA: Insufficient documentation

## 2019-12-24 DIAGNOSIS — Y93H9 Activity, other involving exterior property and land maintenance, building and construction: Secondary | ICD-10-CM | POA: Insufficient documentation

## 2019-12-24 DIAGNOSIS — Y999 Unspecified external cause status: Secondary | ICD-10-CM | POA: Insufficient documentation

## 2019-12-24 DIAGNOSIS — S81812A Laceration without foreign body, left lower leg, initial encounter: Secondary | ICD-10-CM

## 2019-12-24 MED ORDER — CEPHALEXIN 500 MG PO CAPS: 500.0000 mg | ORAL_CAPSULE | Freq: Four times a day (QID) | ORAL | 0 refills | Status: DC

## 2019-12-24 MED ORDER — ONDANSETRON HCL 4 MG/2ML IJ SOLN
4.0000 mg | Freq: Once | INTRAMUSCULAR | Status: AC
  Administered 2019-12-24: 15:00:00 4 mg via INTRAVENOUS
  Filled 2019-12-24: qty 2

## 2019-12-24 MED ORDER — HYDROCODONE-ACETAMINOPHEN 5-325 MG PO TABS: ORAL_TABLET | ORAL | 0 refills | Status: DC

## 2019-12-24 MED ORDER — POVIDONE-IODINE 10 % EX SOLN
CUTANEOUS | Status: DC | PRN
  Filled 2019-12-24: qty 15

## 2019-12-24 MED ORDER — HYDROMORPHONE HCL 1 MG/ML IJ SOLN
1.0000 mg | Freq: Once | INTRAMUSCULAR | Status: AC
  Administered 2019-12-24: 1 mg via INTRAVENOUS
  Filled 2019-12-24: qty 1

## 2019-12-24 MED ORDER — CEPHALEXIN 500 MG PO CAPS
500.0000 mg | ORAL_CAPSULE | Freq: Once | ORAL | Status: AC
  Administered 2019-12-24: 500 mg via ORAL
  Filled 2019-12-24: qty 1

## 2019-12-24 MED ORDER — LIDOCAINE-EPINEPHRINE (PF) 2 %-1:200000 IJ SOLN
10.0000 mL | Freq: Once | INTRAMUSCULAR | Status: AC
  Administered 2019-12-24: 10 mL via INTRADERMAL
  Filled 2019-12-24: qty 10

## 2019-12-24 MED ORDER — IBUPROFEN 800 MG PO TABS: 800.0000 mg | ORAL_TABLET | Freq: Three times a day (TID) | ORAL | 0 refills | Status: DC

## 2019-12-24 NOTE — ED Notes (Signed)
Telfa and ace bandage applied to left upper thigh. Patient tolerated well.

## 2019-12-24 NOTE — Discharge Instructions (Signed)
Keep the wound clean with mild soap and water and keep it bandaged.  Return here in 2 days for recheck.  Staples out in 10 days.  Avoid excessive bending of your left leg and no squatting.  You may apply ice packs on/off

## 2019-12-24 NOTE — ED Triage Notes (Signed)
Patient has large laceration to left leg. Per patient cut leg with chainsaw while cutting trees. Laceration approx 7cm x 2.5cm. Bleeding controlled. CNS intact. Per patient tetanus vaccination up-to-date.
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
## 2019-12-27 ENCOUNTER — Encounter (HOSPITAL_COMMUNITY): Payer: Self-pay | Admitting: Emergency Medicine

## 2019-12-27 ENCOUNTER — Emergency Department (HOSPITAL_COMMUNITY)
Admission: EM | Admit: 2019-12-27 | Discharge: 2019-12-27 | Disposition: A | Discharge status: Home / Self Care | Payer: Self-pay | Attending: Emergency Medicine | Admitting: Emergency Medicine

## 2019-12-27 ENCOUNTER — Other Ambulatory Visit: Payer: Self-pay

## 2019-12-27 DIAGNOSIS — Y999 Unspecified external cause status: Secondary | ICD-10-CM | POA: Insufficient documentation

## 2019-12-27 DIAGNOSIS — Y929 Unspecified place or not applicable: Secondary | ICD-10-CM | POA: Insufficient documentation

## 2019-12-27 DIAGNOSIS — Z5189 Encounter for other specified aftercare: Secondary | ICD-10-CM

## 2019-12-27 DIAGNOSIS — Y939 Activity, unspecified: Secondary | ICD-10-CM | POA: Insufficient documentation

## 2019-12-27 DIAGNOSIS — X58XXXD Exposure to other specified factors, subsequent encounter: Secondary | ICD-10-CM | POA: Insufficient documentation

## 2019-12-27 DIAGNOSIS — S71112D Laceration without foreign body, left thigh, subsequent encounter: Secondary | ICD-10-CM | POA: Insufficient documentation

## 2019-12-27 NOTE — ED Triage Notes (Signed)
Pt is here for a wound check to his left leg with sutures and staples from a laceration on Sunday.
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
## 2019-12-27 NOTE — Discharge Instructions (Addendum)
Return in 7 days for staple removal

## 2020-01-03 ENCOUNTER — Encounter (HOSPITAL_COMMUNITY): Payer: Self-pay | Admitting: Emergency Medicine

## 2020-01-03 ENCOUNTER — Other Ambulatory Visit: Payer: Self-pay

## 2020-01-03 ENCOUNTER — Emergency Department (HOSPITAL_COMMUNITY)
Admission: EM | Admit: 2020-01-03 | Discharge: 2020-01-03 | Disposition: A | Discharge status: Home / Self Care | Payer: Self-pay | Attending: Emergency Medicine | Admitting: Emergency Medicine

## 2020-01-03 DIAGNOSIS — F17210 Nicotine dependence, cigarettes, uncomplicated: Secondary | ICD-10-CM | POA: Insufficient documentation

## 2020-01-03 DIAGNOSIS — W293XXD Contact with powered garden and outdoor hand tools and machinery, subsequent encounter: Secondary | ICD-10-CM | POA: Insufficient documentation

## 2020-01-03 DIAGNOSIS — Z4802 Encounter for removal of sutures: Secondary | ICD-10-CM

## 2020-01-03 DIAGNOSIS — S71112D Laceration without foreign body, left thigh, subsequent encounter: Secondary | ICD-10-CM | POA: Insufficient documentation

## 2020-01-03 MED ORDER — BACITRACIN ZINC 500 UNIT/GM EX OINT
TOPICAL_OINTMENT | Freq: Once | CUTANEOUS | Status: AC
  Administered 2020-01-03: 1 via TOPICAL
  Filled 2020-01-03: qty 0.9

## 2020-01-03 NOTE — ED Triage Notes (Signed)
Here 9 days ago for chainsaw injury to left thigh.  Staple removal.

## 2020-01-03 NOTE — Discharge Instructions (Addendum)
You have been diagnosed today with Encounter for suture removal.  At this time there does not appear to be the presence of an emergent medical condition, however there is always the potential for conditions to change. Please read and follow the below instructions.  Please return to the Emergency Department immediately for any new or worsening symptoms. Please be sure to follow up with your Primary Care Provider within one week regarding your visit today; please call their office to schedule an appointment even if you are feeling better for a follow-up visit. Please finish taking your antibiotic medication Keflex until complete.  Please drink plenty of water and get plenty of rest.  Please change your bandage at least twice a day and if you become sweaty or dirty change it more frequently.  You may clean the area gently with clean soapy water and apply a small amount of antibiotic ointment daily.  Get help right away if: You have a fever. You have redness that is spreading from your wound. You have any new/concerning or worsening of symptoms  Please read the additional information packets attached to your discharge summary.  Do not take your medicine if  develop an itchy rash, swelling in your mouth or lips, or difficulty breathing; call 911 and seek immediate emergency medical attention if this occurs.  Note: Portions of this text may have been transcribed using voice recognition software. Every effort was made to ensure accuracy; however, inadvertent computerized transcription errors may still be present.
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
## 2020-07-28 ENCOUNTER — Other Ambulatory Visit: Payer: Self-pay

## 2020-07-28 ENCOUNTER — Encounter: Payer: Self-pay | Admitting: Emergency Medicine

## 2020-07-28 ENCOUNTER — Emergency Department
Admission: EM | Admit: 2020-07-28 | Discharge: 2020-07-28 | Disposition: A | Discharge status: Home / Self Care | Payer: Self-pay | Attending: Emergency Medicine | Admitting: Emergency Medicine

## 2020-07-28 DIAGNOSIS — F17210 Nicotine dependence, cigarettes, uncomplicated: Secondary | ICD-10-CM | POA: Insufficient documentation

## 2020-07-28 DIAGNOSIS — F19230 Other psychoactive substance dependence with withdrawal, uncomplicated: Secondary | ICD-10-CM | POA: Insufficient documentation

## 2020-07-28 DIAGNOSIS — F99 Mental disorder, not otherwise specified: Secondary | ICD-10-CM | POA: Insufficient documentation

## 2020-07-28 DIAGNOSIS — F1510 Other stimulant abuse, uncomplicated: Secondary | ICD-10-CM | POA: Insufficient documentation

## 2020-07-28 LAB — URINE DRUG SCREEN, QUALITATIVE (ARMC ONLY)
Amphetamines, Ur Screen: POSITIVE — AB
Barbiturates, Ur Screen: NOT DETECTED
Benzodiazepine, Ur Scrn: NOT DETECTED
Cannabinoid 50 Ng, Ur ~~LOC~~: NOT DETECTED
Cocaine Metabolite,Ur ~~LOC~~: NOT DETECTED
MDMA (Ecstasy)Ur Screen: NOT DETECTED
Methadone Scn, Ur: NOT DETECTED
Opiate, Ur Screen: NOT DETECTED
Phencyclidine (PCP) Ur S: NOT DETECTED
Tricyclic, Ur Screen: NOT DETECTED

## 2020-07-28 LAB — COMPREHENSIVE METABOLIC PANEL
ALT: 41 U/L (ref 0–44)
AST: 60 U/L — ABNORMAL HIGH (ref 15–41)
Albumin: 4.7 g/dL (ref 3.5–5.0)
Alkaline Phosphatase: 72 U/L (ref 38–126)
Anion gap: 11 (ref 5–15)
BUN: 16 mg/dL (ref 6–20)
CO2: 23 mmol/L (ref 22–32)
Calcium: 9.5 mg/dL (ref 8.9–10.3)
Chloride: 105 mmol/L (ref 98–111)
Creatinine, Ser: 1.03 mg/dL (ref 0.61–1.24)
GFR, Estimated: >60 mL/min (ref >60)
Glucose, Bld: 105 mg/dL — ABNORMAL HIGH (ref 70–99)
Potassium: 3.8 mmol/L (ref 3.5–5.1)
Sodium: 139 mmol/L (ref 135–145)
Total Bilirubin: 1.9 mg/dL — ABNORMAL HIGH (ref 0.3–1.2)
Total Protein: 7.7 g/dL (ref 6.5–8.1)

## 2020-07-28 LAB — CBC
HCT: 42.1 % (ref 39.0–52.0)
Hemoglobin: 15.1 g/dL (ref 13.0–17.0)
MCH: 29.4 pg (ref 26.0–34.0)
MCHC: 35.9 g/dL (ref 30.0–36.0)
MCV: 82.1 fL (ref 80.0–100.0)
Platelets: 201 10*3/uL (ref 150–400)
RBC: 5.13 MIL/uL (ref 4.22–5.81)
RDW: 12.4 % (ref 11.5–15.5)
WBC: 8.1 10*3/uL (ref 4.0–10.5)
nRBC: 0 % (ref 0.0–0.2)

## 2020-07-28 LAB — ETHANOL: Alcohol, Ethyl (B): <10 mg/dL (ref <10)

## 2020-07-28 NOTE — Discharge Instructions (Signed)
Please ensure you follow-up with your therapist on Tuesday to discuss treatment for your methamphetamine use.  You are being discharged with a printout of additional local resources if you need another source of care.  If you develop any suicidal thoughts, or feel you need additional help, please return to the ED.
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
## 2021-06-08 IMAGING — CR DG TIBIA/FIBULA 2V*L*
4 series · 4 of 4 positions shown · non-contrast
Comparison: None.

CLINICAL DATA: Per Triage Note: Pt arrives to ED c/o of laceration
to left shin after hitting it with a chain saw this evening.
Laceration is approx 2 inches in length with bleeding controlled at
this time.

EXAM:
LEFT TIBIA AND FIBULA - 2 VIEW

[ap (1 of 2)]
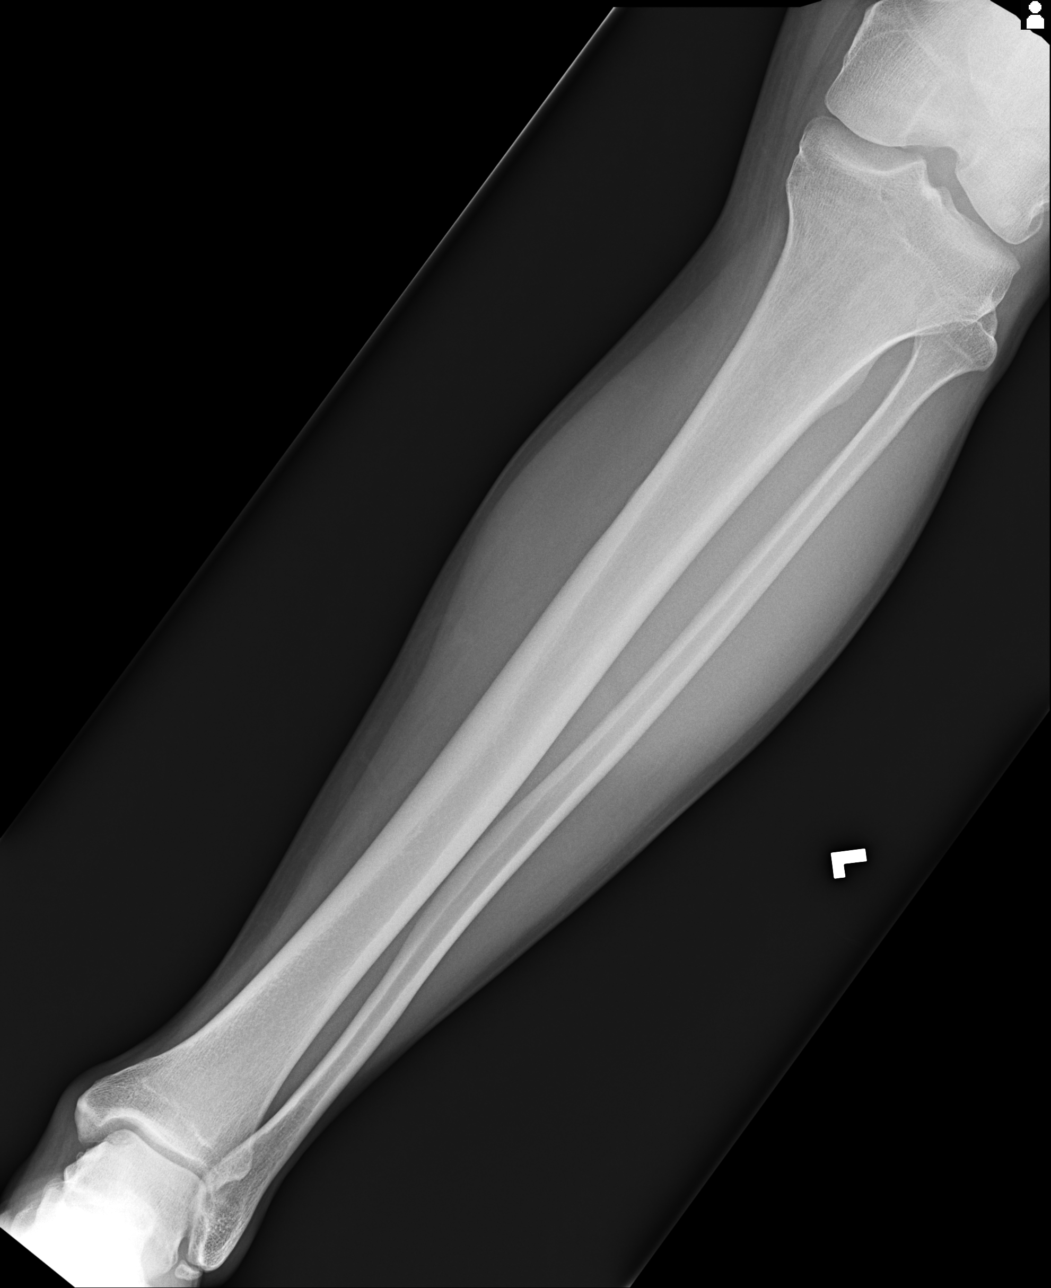

[lat (1 of 2)]
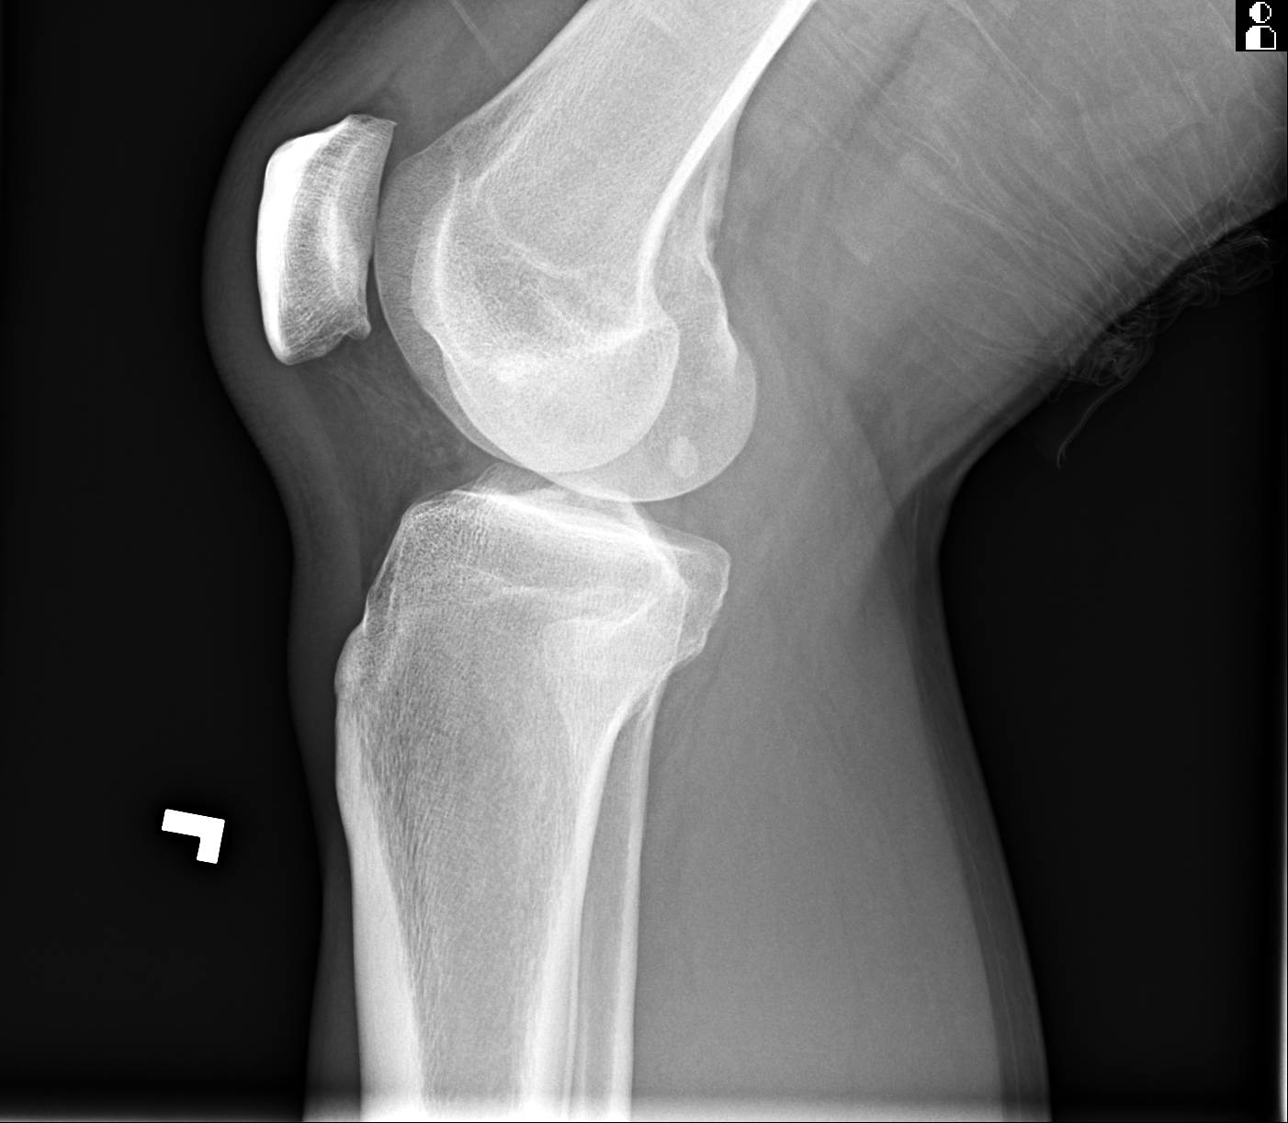

[ap (2 of 2)]
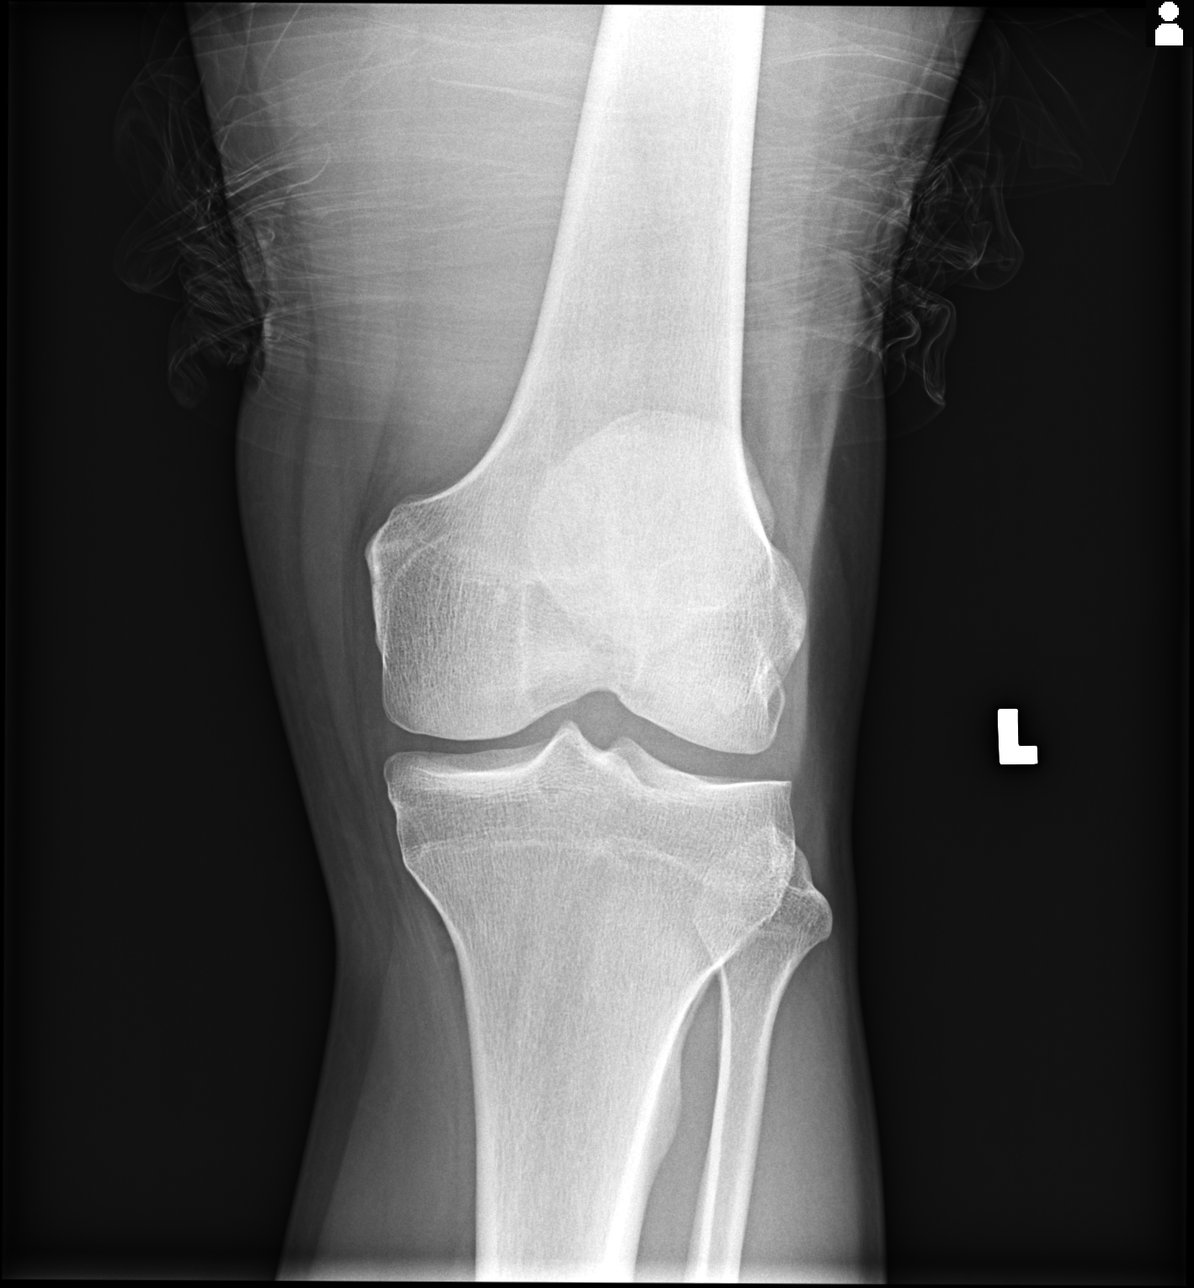

[lat (2 of 2)]
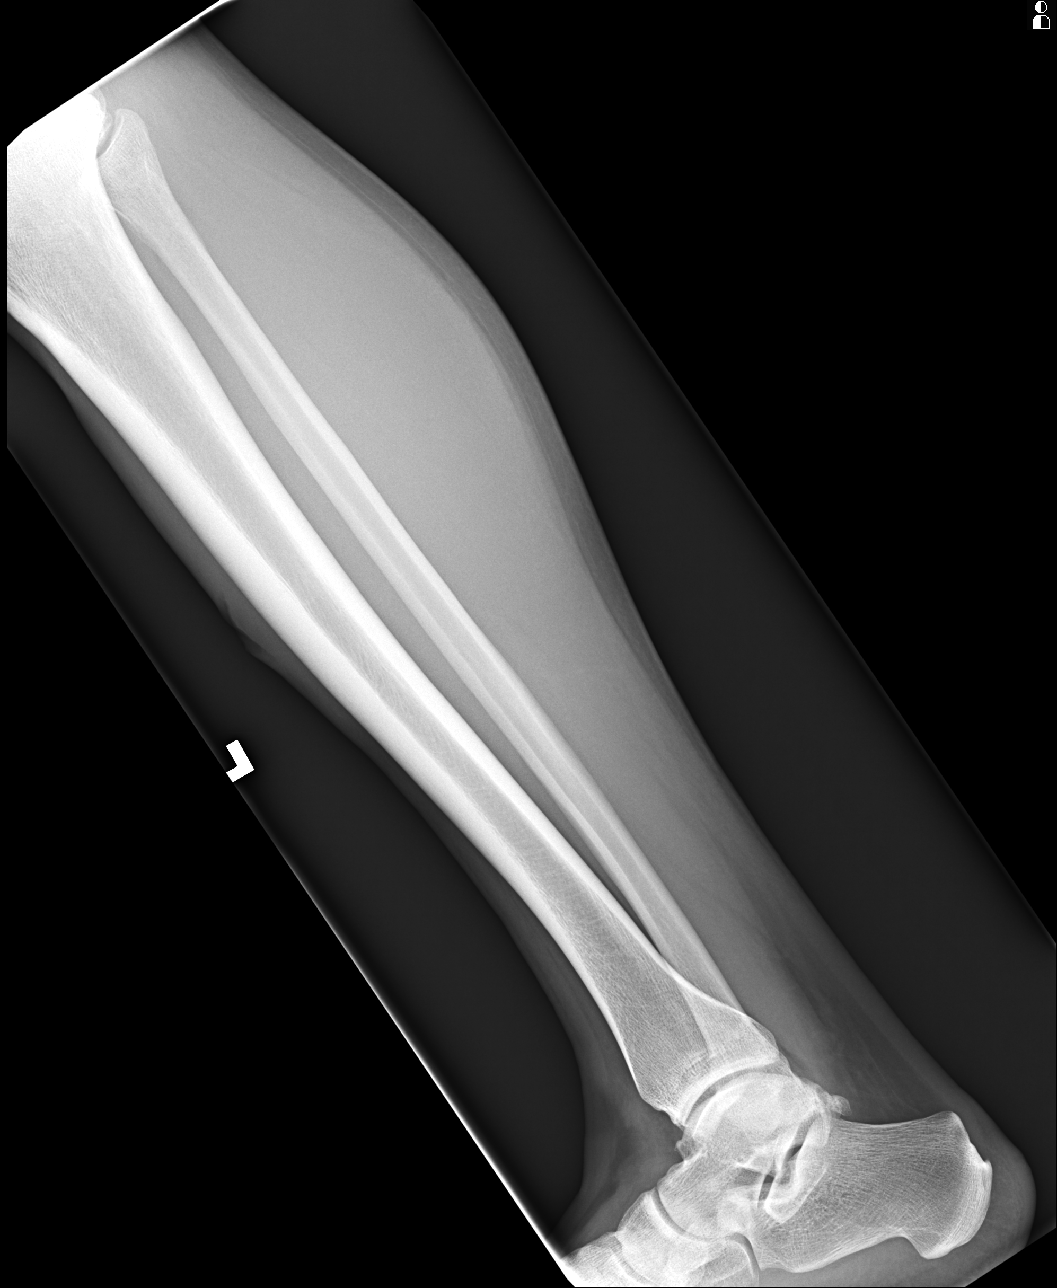

[4 of 4 positions shown; findings below may reference images not displayed]

FINDINGS: No fracture or bone lesion.

Knee and ankle joints are normally aligned.

There is a laceration of the anterior mid leg. No radiopaque foreign
body.
IMPRESSION: 1. No fracture or dislocation.
2. No radiopaque foreign body.

## 2022-03-31 ENCOUNTER — Telehealth: Payer: Managed Care, Other (non HMO) | Admitting: Emergency Medicine

## 2022-03-31 DIAGNOSIS — R040 Epistaxis: Secondary | ICD-10-CM | POA: Diagnosis not present

## 2022-10-02 ENCOUNTER — Ambulatory Visit: Payer: Managed Care, Other (non HMO) | Attending: Internal Medicine | Admitting: Internal Medicine

## 2022-10-02 ENCOUNTER — Telehealth: Payer: Self-pay

## 2022-10-02 ENCOUNTER — Encounter: Payer: Self-pay | Admitting: Internal Medicine

## 2022-10-02 VITALS — BP 120/86 | Ht 70.0 in | Wt 214.0 lb

## 2022-10-02 DIAGNOSIS — E781 Pure hyperglyceridemia: Secondary | ICD-10-CM

## 2022-10-02 DIAGNOSIS — I499 Cardiac arrhythmia, unspecified: Secondary | ICD-10-CM | POA: Diagnosis not present

## 2022-10-02 DIAGNOSIS — Z139 Encounter for screening, unspecified: Secondary | ICD-10-CM | POA: Diagnosis not present

## 2022-10-02 DIAGNOSIS — I25119 Atherosclerotic heart disease of native coronary artery with unspecified angina pectoris: Secondary | ICD-10-CM

## 2022-10-02 DIAGNOSIS — Z136 Encounter for screening for cardiovascular disorders: Secondary | ICD-10-CM | POA: Insufficient documentation

## 2022-10-02 NOTE — Progress Notes (Addendum)
Cardiology Office Note  Date: 10/02/2022   ID: Brent Owens, DOB July 23, 1991, MRN [id number]  PCP:  Karl Bales, NP  Cardiologist:  None Electrophysiologist:  None   Reason for Office Visit: Evaluation of irregular heart rate at the request of Yetta Barre, NP   History of Present Illness: Brent Owens Reason is a 31 y.o. male with no PMH was referred to cardiology clinic for evaluation of irregular heart rate. Accompanied by wife and children.  Patient had inguinal hernia repair at [facility] in 2018 when he was told he had irregular heart rate and had to be checked out. He was not seen by cardiologist at that time. He denies having any palpitations but has occasional skipped beats, denies dizziness/lightness, syncope, angina, DOE, LE swelling.  Wife is extremely worried about family history of heart disease in the patient's family and hence wants to be checked out by cardiology.  Patient's mother passed away in her 87s and father passed away in his 53s to 24s with unknown heart disease. No history of pacemaker/defibrillator in the family. No history of early deaths in the family.  Current smoker, denies alcohol use and illicit drug abuse.   Past Surgical History:  Procedure Laterality Date   HERNIA REPAIR     ORIF ACETABULAR FRACTURE Right 05/27/2018   Procedure: TREATMENT OF ACETABULAR FRACUTURE UNDER ANESTHESIA;  Surgeon: Myrene Galas, MD;  Location: [facility] OR;  Service: Orthopedics;  Laterality: Right;    Current Outpatient Medications  Medication Sig Dispense Refill   traZODone (DESYREL) 50 MG tablet Take by mouth.     No current facility-administered medications for this visit.   Allergies:  Amoxicillin   Social History: The patient  reports that he has been smoking cigarettes. He has a 6.00 pack-year smoking history. He has never used smokeless tobacco. He reports that he does not currently use alcohol. He reports that he does not use drugs.   Family History: The patient's  family history is not on file.   ROS:  Please see the history of present illness. Otherwise, complete review of systems is positive for none.  All other systems are reviewed and negative.   Physical Exam: VS:  BP 120/86   Ht 5\' 10"  (1.778 m)   Wt 214 lb (97.1 kg)   SpO2 92%   BMI 30.71 kg/m , BMI Body mass index is 30.71 kg/m.  Wt Readings from Last 3 Encounters:  10/02/22 214 lb (97.1 kg)  07/28/20 180 lb (81.6 kg)  01/03/20 170 lb (77.1 kg)    General: Patient appears comfortable at rest. HEENT: Conjunctiva and lids normal, oropharynx clear with moist mucosa. Neck: Supple, no elevated JVP or carotid bruits, no thyromegaly. Lungs: Clear to auscultation, nonlabored breathing at rest. Cardiac: Regular rate and rhythm, no S3 or significant systolic murmur, no pericardial rub. Abdomen: Soft, nontender, no hepatomegaly, bowel sounds present, no guarding or rebound. Extremities: No pitting edema, distal pulses 2+. Skin: Warm and dry. Musculoskeletal: No kyphosis. Neuropsychiatric: Alert and oriented x3, affect grossly appropriate.  ECG:  An ECG dated 10/02/22 was personally reviewed today and demonstrated:  NSR and no ST-T changes  Recent Labwork: No results found for requested labs within last 365 days.  No results found for: "CHOL", "TRIG", "HDL", "CHOLHDL", "VLDL", "LDLCALC", "LDLDIRECT"  Other Studies Reviewed Today: I personally reviewed the heart care referral records from the PCP  Assessment and Plan: Patient is a 31 year old M with no PMH was referred to cardiology clinic for evaluation of irregular heart  rate.  # Hx of irregular heart rate -Patient was told in 2018 that he had irregular heart rate and needs to be checked by PCP/cardiology. He has occasional skipped beats and no palpitations.  Event monitor was cost prohibitive and hence recommended patient to purchase a smart watch and monitor for any irregular heart rate.   # Hypertriglyceridemia -Patient's wife  showed lipid panel results obtained from the PCP.  LDL was 75 and triglycerides were 284. Patient eats bread, cheese, daily meat. No vegetables in the diet.  Instructed patient to eat low-fat diet, include vegetables and cut down on the meat.  Recheck lipid panel in 3 months.  # Screening of CAD -Patient's wife is extremely worried about patient's family history of heart disease. Obtain CT coronary calcium score. Ideally it should be 0 given his young age of 31 years. If it is more than 0, he will need to be started on cardioprotective medications and make changes to his lifestyle.  I have spent a total of 45 minutes with patient reviewing chart , EKGs, labs and examining patient as well as establishing an assessment and plan that was discussed with the patient.  > 50% of time was spent in direct patient care.     Medication Adjustments/Labs and Tests Ordered: Current medicines are reviewed at length with the patient today.  Concerns regarding medicines are outlined above.   Tests Ordered: No orders of the defined types were placed in this encounter.   Medication Changes: No orders of the defined types were placed in this encounter.   Disposition:  Follow up prn  Signed, Anelis Hrivnak Verne Spurr, MD, 10/02/2022 8:48 AM    [facility] at [facility] [address], [place], [place] [address]

## 2022-10-02 NOTE — Telephone Encounter (Signed)
The cost of the Zio live monitor is $375.00 out of pocket with insurance. Pt would prefer to purchase a smart watch to record his heart rate, ok per Dr. Lazarus Salines.

## 2022-10-02 NOTE — Patient Instructions (Addendum)
Medication Instructions:  Your physician recommends that you continue on your current medications as directed. Please refer to the Current Medication list given to you today.   *If you need a refill on your cardiac medications before your next appointment, please call your pharmacy*   Lab Work: NONE ordered at this time of appointment   If you have labs (blood work) drawn today and your tests are completely normal, you will receive your results only by: MyChart Message (if you have MyChart) OR A paper copy in the mail If you have any lab test that is abnormal or we need to change your treatment, we will call you to review the results.   Testing/Procedures:  Calcium Score   Follow-Up: At [facility], you and your health needs are our priority.  As part of our continuing mission to provide you with exceptional heart care, we have created designated Provider Care Teams.  These Care Teams include your primary Cardiologist (physician) and Advanced Practice Providers (APPs -  Physician Assistants and Nurse Practitioners) who all work together to provide you with the care you need, when you need it.  We recommend signing up for the patient portal called "MyChart".  Sign up information is provided on this After Visit Summary.  MyChart is used to connect with patients for Virtual Visits (Telemedicine).  Patients are able to view lab/test results, encounter notes, upcoming appointments, etc.  Non-urgent messages can be sent to your provider as well.   To learn more about what you can do with MyChart, go to [email or web address].    Your next appointment:    As needed   The format for your next appointment:   In Person  Provider:   Luane School, MD    Other Instructions Recommends a smart watch or fit bit to record your heart rate.   Important Information About Sugar

## 2022-11-13 ENCOUNTER — Other Ambulatory Visit (HOSPITAL_COMMUNITY): Payer: Medicaid Other

## 2023-02-08 ENCOUNTER — Ambulatory Visit (INDEPENDENT_AMBULATORY_CARE_PROVIDER_SITE_OTHER): Payer: Managed Care, Other (non HMO) | Admitting: Internal Medicine

## 2023-02-08 ENCOUNTER — Other Ambulatory Visit: Payer: Self-pay

## 2023-02-08 ENCOUNTER — Encounter: Payer: Self-pay | Admitting: Internal Medicine

## 2023-02-08 VITALS — BP 128/88 | HR 72 | Temp 98.9°F | Resp 20 | Ht 70.0 in | Wt 209.5 lb

## 2023-02-08 DIAGNOSIS — J3089 Other allergic rhinitis: Secondary | ICD-10-CM | POA: Diagnosis not present

## 2023-02-08 DIAGNOSIS — R0982 Postnasal drip: Secondary | ICD-10-CM

## 2023-02-08 DIAGNOSIS — J343 Hypertrophy of nasal turbinates: Secondary | ICD-10-CM | POA: Diagnosis not present

## 2023-02-08 DIAGNOSIS — J342 Deviated nasal septum: Secondary | ICD-10-CM

## 2023-02-08 DIAGNOSIS — T6391XA Toxic effect of contact with unspecified venomous animal, accidental (unintentional), initial encounter: Secondary | ICD-10-CM

## 2023-02-08 MED ORDER — CETIRIZINE HCL 10 MG PO TABS: 10.0000 mg | ORAL_TABLET | Freq: Every day | ORAL | 5 refills | Status: AC

## 2023-02-08 MED ORDER — AZELASTINE HCL 0.1 % NA SOLN: 1.0000 | Freq: Two times a day (BID) | NASAL | 5 refills | Status: AC | PRN

## 2023-02-08 MED ORDER — OLOPATADINE HCL 0.2 % OP SOLN: 1.0000 [drp] | Freq: Every day | OPHTHALMIC | 5 refills | Status: AC | PRN

## 2023-02-08 MED ORDER — EPINEPHRINE 0.3 MG/0.3ML IJ SOAJ: 0.3000 mg | INTRAMUSCULAR | 1 refills | Status: AC | PRN

## 2023-02-08 MED ORDER — FLUTICASONE PROPIONATE 50 MCG/ACT NA SUSP: 2.0000 | Freq: Every day | NASAL | 5 refills | Status: AC

## 2023-02-08 NOTE — Progress Notes (Signed)
NEW PATIENT  Date of Service/Encounter:  02/08/23  Consult requested by: Karl Bales, NP   Subjective:   Brent Owens (DOB: January 06, 1991) is a 32 y.o. male who presents to the clinic on 02/08/2023 with a chief complaint of Allergy Testing (Yellow jackets-swelling,difficulty breathing, reaction occurred last year ) .    History obtained from: chart review and patient.  Rhinitis:  Started years ago.  Symptoms include: nasal congestion, rhinorrhea, and post nasal drainage  Occurs seasonally-Spring Potential triggers: not sure Treatments tried:  Nothing  Previous allergy testing: no History of reflux/heartburn: no History of sinus surgery: no Nonallergic triggers: none   Concern for Stinging Insect Allergy: Insect of concern: yellow jacket  History of reaction:  About 2 years ago, he was working out in the yard, he recalls getting stung multiple times.  He had facial swelling and had difficulty breathing a couple of minutes afterwards.  Called 911; EMS treated him at home with oxygen and was given benadryl.  Did not have to go to the ER.  Another time got stung when he was working outside, got stung on the leg and had large local reactions. No other symptoms.  Previous testing: none Carries an epinephrine autoinjector: no  Past Medical History: History reviewed. No pertinent past medical history.  Past Surgical History: Past Surgical History:  Procedure Laterality Date   HERNIA REPAIR     ORIF ACETABULAR FRACTURE Right 05/27/2018   Procedure: TREATMENT OF ACETABULAR FRACUTURE UNDER ANESTHESIA;  Surgeon: Myrene Galas, MD;  Location: [facility] OR;  Service: Orthopedics;  Laterality: Right;    Family History: History reviewed. No pertinent family history.  Social History:  Lives in a 20 year house Flooring in bedroom: laminate Pets:  fish, lizard, and dog Tobacco use/exposure: 2010-2020 smoked cigarette; 2020-current vaping  Job: Games developer  Medication List:   Allergies as of 02/08/2023       Reactions   Amoxicillin         Medication List        Accurate as of Feb 08, 2023 10:28 AM. If you have any questions, ask your nurse or doctor.          azelastine 0.1 % nasal spray Commonly known as: ASTELIN Place 1 spray into both nostrils 2 (two) times daily as needed for rhinitis. Use in each nostril as directed Started by: Birder Robson, MD   cetirizine 10 MG tablet Commonly known as: ZyrTEC Allergy Take 1 tablet (10 mg total) by mouth daily. Started by: Birder Robson, MD   EPINEPHrine 0.3 mg/0.3 mL Soaj injection Commonly known as: EpiPen 2-Pak Inject 0.3 mg into the muscle as needed for anaphylaxis. Started by: Birder Robson, MD   fluticasone 50 MCG/ACT nasal spray Commonly known as: FLONASE Place 2 sprays into both nostrils daily. Started by: Birder Robson, MD   Olopatadine HCl 0.2 % Soln Apply 1 drop to eye daily as needed (itchy watery eyes). Started by: Birder Robson, MD   traZODone 50 MG tablet Commonly known as: DESYREL Take by mouth.         REVIEW OF SYSTEMS: Pertinent positives and negatives discussed in HPI.   Objective:   Physical Exam: BP 128/88   Pulse 72   Temp 98.9 F (37.2 C)   Resp 20   Ht 5\' 10"  (1.778 m)   Wt 209 lb 8 oz (95 kg)   SpO2 97%   BMI 30.06 kg/m  Body mass index is 30.06 kg/m.  GEN: alert, well developed HEENT: clear conjunctiva, TM grey and translucent, + septal deviation, nose with + inferior turbinate hypertrophy, pink nasal mucosa, slight clear rhinorrhea, + cobblestoning HEART: regular rate and rhythm, no murmur LUNGS: clear to auscultation bilaterally, no coughing, unlabored respiration ABDOMEN: soft, non distended  SKIN: no rashes or lesions  Reviewed:  03/31/2022: seen by Vassie Moment NP for epistaxis and also sick, undergoing treatment with Augmentin for ear infection.  Has had fevers. Not on any nose sprays.  Switched from Dayquil to mucinex. Use saline spray.    10/02/2022: seen by Cardiology Dr. Jenene Slicker for irregular heart rate, asymptomatic.  Discussed using smart watch to monitor due to event monitor being cost prohibitive.  Plan to get CT coronary calcium score.   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 Plan to have a therapist appointment in 2 days for rehab.  No active SI, HI, hallucinations. Discharged home.   Skin Testing:  Skin prick testing was placed, which includes aeroallergens/foods, histamine control, and saline control.  Verbal consent was obtained prior to placing test.  Patient tolerated procedure well.  Allergy testing results were read and interpreted by myself, documented by clinical staff. Adequate positive and negative control.  Results discussed with patient/family.  Airborne Adult Perc - 02/08/23 0921     Time Antigen Placed 1610    Allergen Manufacturer Waynette Buttery    Location Back    Number of Test 59    1. Control-Buffer 50% Glycerol Negative    2. Control-Histamine 1 mg/ml 3+    3. Albumin saline Negative    4. Bahia Negative    5. French Southern Territories Negative    6. Johnson Negative    7. [place] Blue Negative    8. Meadow Fescue Negative    9. Perennial Rye Negative    10. Sweet Vernal Negative    11. Timothy Negative    12. Cocklebur Negative    13. Burweed Marshelder Negative    14. Ragweed, short Negative    15. Ragweed, Giant Negative    16. Plantain,  English Negative    17. Lamb's Quarters Negative    18. Sheep Sorrell Negative    19. Rough Pigweed Negative    20. Marsh Elder, Rough Negative    21. Mugwort, Common Negative    22. Ash mix Negative    23. Birch mix Negative    24. Beech American Negative    25. Box, Elder Negative    26. Cedar, red Negative    27. Cottonwood, Guinea-Bissau Negative    28. Elm mix Negative    29. Hickory Negative    30. Maple mix Negative    31. Oak, Guinea-[place]issau mix Negative    32. Pecan Pollen Negative    33. Pine mix Negative     34. Sycamore Eastern Negative    35. Walnut, Black Pollen Negative    36. Alternaria alternata Negative    37. Cladosporium Herbarum Negative    38. Aspergillus mix Negative    39. Penicillium mix Negative    40. Bipolaris sorokiniana (Helminthosporium) Negative    41. Drechslera spicifera (Curvularia) Negative    42. Mucor plumbeus Negative    43. Fusarium moniliforme Negative    44. Aureobasidium pullulans (pullulara) Negative    45. Rhizopus oryzae Negative    46. Botrytis cinera Negative    47. Epicoccum nigrum Negative    48. Phoma betae Negative    49. Candida Albicans Negative  50. Trichophyton mentagrophytes Negative    51. Mite, D Farinae  5,000 AU/ml 3+    52. Mite, D Pteronyssinus  5,000 AU/ml 3+    53. Cat Hair 10,000 BAU/ml Negative    54.  Dog Epithelia Negative    55. Mixed Feathers Negative    56. Horse Epithelia Negative    57. Cockroach, German Negative    58. Mouse Negative    59. Tobacco Leaf Negative             Intradermal - 02/08/23 0959     Time Antigen Placed 1914    Allergen Manufacturer Waynette Buttery    Location Arm    Number of Test 14    Control Negative    French Southern Territories Negative    Johnson Negative    7 Grass Negative    Ragweed mix Negative    Weed mix Negative    Tree mix Negative    Mold 1 Negative    Mold 2 3+    Mold 3 Negative    Mold 4 3+    Cat Negative    Dog Negative    Cockroach Negative               Assessment:   1. Venom-induced anaphylaxis, accidental or unintentional, initial encounter   2. Nasal turbinate hypertrophy   3. Nasal septal deviation   4. Perennial allergic rhinitis   5. Post-nasal drainage     Plan/Recommendations:  Allergic Rhinitis: - Due to turbinate hypertrophy and seasonal symptoms, performed skin testing to identify aeroallergen triggers.   - Positive skin test 02/2023: dust mite and mold  - Avoidance measures discussed. - Use nasal saline rinses before nose sprays such as with Neilmed Sinus  Rinse.  Use distilled water.   - Use Flonase 2 sprays each nostril daily. Aim upward and outward. - Use Azelastine 1-2 sprays each nostril twice daily as needed. Aim upward and outward. - Use Zyrtec 10 mg daily.  - For eyes, use Olopatadine or Ketotifen 1 eye drop daily as needed for itchy, watery eyes.  Available over the counter, if not covered by insurance.  - Consider allergy shots as long term control of your symptoms by teaching your immune system to be more tolerant of your allergy triggers  Concern For Stinging Insect Allergy: - Initial rxn: 2022 got stung by yellow jacket multiple times and had facial swelling with difficulty breathing - based on today's history, will obtain labs for stinging insects - please carry Epinephrine autoinjector at all times in case of accidental sting. - for SKIN only reaction, okay to take Benadryl 25mg  capsules every 6 hours - for SKIN + ANY additional symptoms, OR IF concern for LIFE THREATENING reaction = Epipen Autoinjector EpiPen 0.3 mg. - If using Epinephrine autoinjector, call 911 or go to the ER.  - practice avoidance measures as outlined below when possible  Return in about 6 weeks (around 03/22/2023).  Alesia Morin, MD [facility]

## 2023-02-08 NOTE — Patient Instructions (Addendum)
Allergic Rhinitis: - Positive skin test 02/2023: dust mite and mold  - Avoidance measures discussed. - Use nasal saline rinses before nose sprays such as with Neilmed Sinus Rinse.  Use distilled water.   - Use Flonase 2 sprays each nostril daily. Aim upward and outward. - Use Azelastine 1-2 sprays each nostril twice daily as needed. Aim upward and outward. - Use Zyrtec 10 mg daily.  - For eyes, use Olopatadine or Ketotifen 1 eye drop daily as needed for itchy, watery eyes.  Available over the counter, if not covered by insurance.  - Consider allergy shots as long term control of your symptoms by teaching your immune system to be more tolerant of your allergy triggers  Concern For Stinging Insect Allergy: - Initial rxn: 2022 got stung by yellow jacket multiple times and had facial swelling with difficulty breathing - based on today's history, will obtain labs for stinging insects - please carry Epinephrine autoinjector at all times in case of accidental sting. - for SKIN only reaction, okay to take Benadryl 25mg  capsules every 6 hours - for SKIN + ANY additional symptoms, OR IF concern for LIFE THREATENING reaction = Epipen Autoinjector EpiPen 0.3 mg. - If using Epinephrine autoinjector, call 911 or go to the ER.  - practice avoidance measures as outlined below when possible     ALLERGEN AVOIDANCE MEASURES   Dust Mites Use central air conditioning and heat; and change the filter monthly.  Pleated filters work better than mesh filters.  Electrostatic filters may also be used; wash the filter monthly.  Window air conditioners may be used, but do not clean the air as well as a central air conditioner.  Change or wash the filter monthly. Keep windows closed.  Do not use attic fans.   Encase the mattress, box springs and pillows with zippered, dust proof covers. Wash the bed linens in hot water weekly.   Remove carpet, especially from the bedroom. Remove stuffed animals, throw pillows, dust  ruffles, heavy drapes and other items that collect dust from the bedroom. Do not use a humidifier.   Use wood, vinyl or leather furniture instead of cloth furniture in the bedroom. Keep the indoor humidity at 30 - 40%.  Monitor with a humidity gauge.  Molds - Indoor avoidance Use air conditioning to reduce indoor humidity.  Do not use a humidifier. Keep indoor humidity at 30 - 40%.  Use a dehumidifier if needed. In the bathroom use an exhaust fan or open a window after showering.  Wipe down damp surfaces after showering.  Clean bathrooms with a mold-killing solution (diluted bleach, or products like Tilex, etc) at least once a month. In the kitchen use an exhaust fan to remove steam from cooking.  Throw away spoiled foods immediately, and empty garbage daily.  Empty water pans below self-defrosting refrigerators frequently. Vent the clothes dryer to the outside. Limit indoor houseplants; mold grows in the dirt.  No houseplants in the bedroom. Remove carpet from the bedroom. Encase the mattress and box springs with a zippered encasing.  Molds - Outdoor avoidance Avoid being outside when the grass is being mowed, or the ground is tilled. Avoid playing in leaves, pine straw, hay, etc.  Dead plant materials contain mold. Avoid going into barns or grain storage areas. Remove leaves, clippings and compost from around the home.

## 2023-02-08 NOTE — Addendum Note (Signed)
Addended by: Elsworth Soho on: 02/08/2023 05:11 PM   Modules accepted: Orders

## 2023-02-13 LAB — ALLERGEN HYMENOPTERA PANEL
Bumblebee: <0.1 kU/L
Honeybee IgE: 0.12 kU/L — AB
Hornet, White Face, IgE: 14.3 kU/L — AB
Hornet, Yellow, IgE: 2.23 kU/L — AB
Paper Wasp IgE: 28.8 kU/L — AB
Yellow Jacket, IgE: 20.8 kU/L — AB

## 2023-03-22 ENCOUNTER — Ambulatory Visit: Payer: Managed Care, Other (non HMO) | Admitting: Internal Medicine

## 2023-03-29 ENCOUNTER — Other Ambulatory Visit: Payer: Self-pay

## 2023-03-29 ENCOUNTER — Encounter: Payer: Self-pay | Admitting: Family Medicine

## 2023-03-29 ENCOUNTER — Ambulatory Visit (INDEPENDENT_AMBULATORY_CARE_PROVIDER_SITE_OTHER): Payer: Managed Care, Other (non HMO) | Admitting: Family Medicine

## 2023-03-29 VITALS — BP 118/82 | HR 72 | Temp 98.3°F | Resp 18

## 2023-03-29 DIAGNOSIS — T6391XA Toxic effect of contact with unspecified venomous animal, accidental (unintentional), initial encounter: Secondary | ICD-10-CM | POA: Insufficient documentation

## 2023-03-29 DIAGNOSIS — T6391XD Toxic effect of contact with unspecified venomous animal, accidental (unintentional), subsequent encounter: Secondary | ICD-10-CM | POA: Diagnosis not present

## 2023-03-29 DIAGNOSIS — H1010 Acute atopic conjunctivitis, unspecified eye: Secondary | ICD-10-CM

## 2023-03-29 DIAGNOSIS — J3089 Other allergic rhinitis: Secondary | ICD-10-CM

## 2023-03-29 DIAGNOSIS — J342 Deviated nasal septum: Secondary | ICD-10-CM | POA: Diagnosis not present

## 2023-03-29 DIAGNOSIS — H1013 Acute atopic conjunctivitis, bilateral: Secondary | ICD-10-CM

## 2023-03-29 NOTE — Progress Notes (Signed)
[address] [place] [address] Dept: [phone]  FOLLOW UP NOTE  Patient ID: Brent Owens, male    DOB: 1991/03/22  Age: 32 y.o. MRN: [id number] Date of Office Visit: 03/29/2023  Assessment  Chief Complaint: Follow-up (```)  HPI Brent Owens is a 32 year old male who presents to the clinic for follow-up visit.  He was last seen in this clinic on 02/08/2023 as a new patient by Dr. Allena Katz for evaluation of stinging insect allergy and allergic rhinitis.   At today's visit, he reports his allergic rhinitis has been moderately well-controlled with symptoms including nasal congestion in the morning, occasional sneeze, and postnasal drainage when working with hay.  He continues cetirizine 10 mg once a day and uses Flonase and azelastine as needed.  He reports that he has not needed to use nasal sprays recently.  His last environmental allergy testing was on 02/08/2023 and was positive to dust mite, mold mix 2, and mold mix 4.  He reports that he does not currently have dust mite covers on his mattress or pillow.  Allergic conjunctivitis is reported as well-controlled with no current medical intervention.  He reports that he has not had any insect stings since his last visit to this clinic.  He does report 2 or 3 previous occasions with large local reactions as well as 1 incident with facial swelling where he reported throat tightness and difficulty breathing.  His last stinging insect panel was on 02/08/2023 and was positive to yellowjacket, yellow hornet, white faced hornet, and paper wasp.  We discussed the benefit of venom immunotherapy and he is not interested in this option at this time.  He reports that he does carry to epinephrine autoinjector sets and is willing to get a baseline tryptase at this time.  He reports that food does not work outside anymore and actually spends very limited time outside at this time.  His current medications are listed in the chart.  Drug  Allergies:  Allergies  Allergen Reactions   Amoxicillin     Physical Exam: BP 118/82   Pulse 72   Temp 98.3 F (36.8 C)   Resp 18   SpO2 97%    Physical Exam Vitals reviewed.  Constitutional:      Appearance: Normal appearance.  HENT:     Head: Normocephalic and atraumatic.     Right Ear: Tympanic membrane normal.     Left Ear: Tympanic membrane normal.     Nose:     Comments: Bilateral nares normal.  Septal deviation noted.  Pharynx normal.  Ears normal.  Eyes normal.    Mouth/Throat:     Pharynx: Oropharynx is clear.  Eyes:     Conjunctiva/sclera: Conjunctivae normal.  Cardiovascular:     Rate and Rhythm: Normal rate and regular rhythm.     Heart sounds: Normal heart sounds. No murmur heard. Pulmonary:     Effort: Pulmonary effort is normal.     Breath sounds: Normal breath sounds.     Comments: Lungs clear to auscultation Musculoskeletal:        General: Normal range of motion.     Cervical back: Normal range of motion and neck supple.  Skin:    General: Skin is warm and dry.  Neurological:     Mental Status: He is alert and oriented to person, place, and time.  Psychiatric:        Mood and Affect: Mood normal.        Behavior: Behavior normal.  Thought Content: Thought content normal.        Judgment: Judgment normal.     Assessment and Plan: 1. Venom-induced anaphylaxis, accidental or unintentional, initial encounter   2. Perennial allergic rhinitis   3. Seasonal allergic conjunctivitis   4. Nasal septal deviation     No orders of the defined types were placed in this encounter.   Patient Instructions  Allergic rhinitis Continue allergen avoidance measures directed toward dust mite and mold as listed below Continue cetirizine 10 mg once a day as needed for runny nose or itch Continue azelastine 2 sprays in each nostril up to twice a day as needed for runny nose  Continue Flonase 2 sprays in each nostril once a day as needed for stuffy  nose Consider saline nasal rinses as needed for nasal symptoms. Use this before any medicated nasal sprays for best result Consider allergen immunotherapy as a long-term control of allergies if your symptoms are not well-controlled by the treatment plan as listed above  Allergic conjunctivitis Some over the counter eye drops include Pataday or ketotifen one drop in each eye once a day as needed for red, itchy eyes OR Zaditor one drop in each eye twice a day as needed for red itchy eyes. Avoid eye drops that say red eye relief as they may contain medications that dry out your eyes.   Stinging insect allergy Continue to avoid stinging insects and have an epinephrine autoinjector set with you at all times.  In case of an allergic reaction, take Benadryl 50 mg every 4 hours, and if life-threatening symptoms occur, inject with EpiPen 0.3 mg. We have placed an order for a lab to help [place] evaluate your tryptase level. We will call you when the results become available Consider venom immunotherapy as a long-term control of stinging insect allergies  Call the clinic if this treatment plan is not working well for you.  Follow up in 1 year or sooner if needed.   Return in about 1 year (around 03/28/2024), or if symptoms worsen or fail to improve.    Thank you for the opportunity to care for this patient.  Please do not hesitate to contact me with questions.  Thermon Leyland, FNP [facility]

## 2023-03-29 NOTE — Patient Instructions (Signed)
Allergic rhinitis Continue allergen avoidance measures directed toward dust mite and mold as listed below Continue cetirizine 10 mg once a day as needed for runny nose or itch Continue azelastine 2 sprays in each nostril up to twice a day as needed for runny nose  Continue Flonase 2 sprays in each nostril once a day as needed for stuffy nose Consider saline nasal rinses as needed for nasal symptoms. Use this before any medicated nasal sprays for best result Consider allergen immunotherapy as a long-term control of allergies if your symptoms are not well-controlled by the treatment plan as listed above  Allergic conjunctivitis Some over the counter eye drops include Pataday or ketotifen one drop in each eye once a day as needed for red, itchy eyes OR Zaditor one drop in each eye twice a day as needed for red itchy eyes. Avoid eye drops that say red eye relief as they may contain medications that dry out your eyes.   Stinging insect allergy Continue to avoid stinging insects and have an epinephrine autoinjector set with you at all times.  In case of an allergic reaction, take Benadryl 50 mg every 4 hours, and if life-threatening symptoms occur, inject with EpiPen 0.3 mg. We have placed an order for a lab to help [place] evaluate your tryptase level. We will call you when the results become available Consider venom immunotherapy as a long-term control of stinging insect allergies  Call the clinic if this treatment plan is not working well for you.  Follow up in 1 year or sooner if needed.   Control of Dust Mite Allergen Dust mites play a major role in allergic asthma and rhinitis. They occur in environments with high humidity wherever human skin is found. Dust mites absorb humidity from the atmosphere (ie, they do not drink) and feed on organic matter (including shed human and animal skin). Dust mites are a microscopic type of insect that you cannot see with the naked eye. High levels of dust mites  have been detected from mattresses, pillows, carpets, upholstered furniture, bed covers, clothes, soft toys and any woven material. The principal allergen of the dust mite is found in its feces. A gram of dust may contain 1,000 mites and 250,000 fecal particles. Mite antigen is easily measured in the air during house cleaning activities. Dust mites do not bite and do not cause harm to humans, other than by triggering allergies/asthma.  Ways to decrease your exposure to dust mites in your home:  1. Encase mattresses, box springs and pillows with a mite-impermeable barrier or cover  2. Wash sheets, blankets and drapes weekly in hot water (130 F) with detergent and dry them in a dryer on the hot setting.  3. Have the room cleaned frequently with a vacuum cleaner and a damp dust-mop. For carpeting or rugs, vacuuming with a vacuum cleaner equipped with a high-efficiency particulate air (HEPA) filter. The dust mite allergic individual should not be in a room which is being cleaned and should wait 1 hour after cleaning before going into the room.  4. Do not sleep on upholstered furniture (eg, couches).  5. If possible removing carpeting, upholstered furniture and drapery from the home is ideal. Horizontal blinds should be eliminated in the rooms where the person spends the most time (bedroom, study, television room). Washable vinyl, roller-type shades are optimal.  6. Remove all non-washable stuffed toys from the bedroom. Wash stuffed toys weekly like sheets and blankets above.  7. Reduce indoor humidity to less  than 50%. Inexpensive humidity monitors can be purchased at most hardware stores. Do not use a humidifier as can make the problem worse and are not recommended.  Control of Mold Allergen Mold and fungi can grow on a variety of surfaces provided certain temperature and moisture conditions exist.  Outdoor molds grow on plants, decaying vegetation and soil.  The major outdoor mold, Alternaria and  Cladosporium, are found in very high numbers during hot and dry conditions.  Generally, a late Summer - Fall peak is seen for common outdoor fungal spores.  Rain will temporarily lower outdoor mold spore count, but counts rise rapidly when the rainy period ends.  The most important indoor molds are Aspergillus and Penicillium.  Dark, humid and poorly ventilated basements are ideal sites for mold growth.  The next most common sites of mold growth are the bathroom and the kitchen.  Outdoor [employer] Use air conditioning and keep windows closed Avoid exposure to decaying vegetation. Avoid leaf raking. Avoid grain handling. Consider wearing a face mask if working in moldy areas.  Indoor Mold Control Maintain humidity below 50%. Clean washable surfaces with 5% bleach solution. Remove sources e.g. Contaminated carpets.
